# Patient Record
Sex: Female | Born: 1970
Health system: Southern US, Community
[De-identification: ages and names within clinical notes are randomized; demographics above are authoritative.]

## PROBLEM LIST (undated history)

## (undated) DIAGNOSIS — L309 Dermatitis, unspecified: Secondary | ICD-10-CM

## (undated) DIAGNOSIS — N840 Polyp of corpus uteri: Secondary | ICD-10-CM

## (undated) DIAGNOSIS — D219 Benign neoplasm of connective and other soft tissue, unspecified: Secondary | ICD-10-CM

## (undated) DIAGNOSIS — B009 Herpesviral infection, unspecified: Secondary | ICD-10-CM

## (undated) DIAGNOSIS — Z8742 Personal history of other diseases of the female genital tract: Secondary | ICD-10-CM

## (undated) DIAGNOSIS — N760 Acute vaginitis: Secondary | ICD-10-CM

## (undated) DIAGNOSIS — J302 Other seasonal allergic rhinitis: Secondary | ICD-10-CM

## (undated) DIAGNOSIS — E559 Vitamin D deficiency, unspecified: Secondary | ICD-10-CM

## (undated) HISTORY — DX: Personal history of other diseases of the female genital tract: Z87.42

## (undated) HISTORY — PX: OTHER SURGICAL HISTORY: SHX169

## (undated) HISTORY — DX: Herpesviral infection, unspecified: B00.9

## (undated) HISTORY — DX: Benign neoplasm of connective and other soft tissue, unspecified: D21.9

## (undated) HISTORY — PX: CYSTECTOMY: SUR359

## (undated) HISTORY — DX: Acute vaginitis: N76.0

## (undated) HISTORY — PX: ENDOMETRIAL ABLATION: SHX621

## (undated) HISTORY — PX: DILATION AND CURETTAGE OF UTERUS: SHX78

## (undated) HISTORY — DX: Polyp of corpus uteri: N84.0

## (undated) HISTORY — PX: TUBAL LIGATION: SHX77

---

## 1999-10-01 ENCOUNTER — Ambulatory Visit (HOSPITAL_COMMUNITY): Admission: RE | Admit: 1999-10-01 | Discharge: 1999-10-01 | Payer: Self-pay | Admitting: *Deleted

## 1999-10-01 ENCOUNTER — Encounter: Payer: Self-pay | Admitting: *Deleted

## 2000-02-19 ENCOUNTER — Inpatient Hospital Stay (HOSPITAL_COMMUNITY): Admission: AD | Admit: 2000-02-19 | Discharge: 2000-02-22 | Payer: Self-pay | Admitting: *Deleted

## 2000-11-09 ENCOUNTER — Inpatient Hospital Stay (HOSPITAL_COMMUNITY): Admission: EM | Admit: 2000-11-09 | Discharge: 2000-11-12 | Payer: Self-pay | Admitting: *Deleted

## 2000-11-13 ENCOUNTER — Other Ambulatory Visit (HOSPITAL_COMMUNITY): Admission: RE | Admit: 2000-11-13 | Discharge: 2000-11-17 | Payer: Self-pay | Admitting: Psychiatry

## 2000-12-30 ENCOUNTER — Inpatient Hospital Stay (HOSPITAL_COMMUNITY): Admission: AD | Admit: 2000-12-30 | Discharge: 2000-12-30 | Payer: Self-pay | Admitting: *Deleted

## 2000-12-30 ENCOUNTER — Ambulatory Visit (HOSPITAL_COMMUNITY): Admission: RE | Admit: 2000-12-30 | Discharge: 2000-12-30 | Payer: Self-pay | Admitting: *Deleted

## 2001-05-11 ENCOUNTER — Emergency Department (HOSPITAL_COMMUNITY): Admission: EM | Admit: 2001-05-11 | Discharge: 2001-05-11 | Payer: Self-pay | Admitting: *Deleted

## 2001-07-06 ENCOUNTER — Emergency Department (HOSPITAL_COMMUNITY): Admission: EM | Admit: 2001-07-06 | Discharge: 2001-07-06 | Payer: Self-pay | Admitting: Emergency Medicine

## 2002-03-07 ENCOUNTER — Emergency Department (HOSPITAL_COMMUNITY): Admission: EM | Admit: 2002-03-07 | Discharge: 2002-03-07 | Payer: Self-pay

## 2002-11-14 ENCOUNTER — Emergency Department (HOSPITAL_COMMUNITY): Admission: EM | Admit: 2002-11-14 | Discharge: 2002-11-14 | Payer: Self-pay | Admitting: Emergency Medicine

## 2003-05-10 ENCOUNTER — Emergency Department (HOSPITAL_COMMUNITY): Admission: EM | Admit: 2003-05-10 | Discharge: 2003-05-10 | Payer: Self-pay | Admitting: Emergency Medicine

## 2004-01-20 ENCOUNTER — Other Ambulatory Visit: Admission: RE | Admit: 2004-01-20 | Discharge: 2004-01-20 | Payer: Self-pay | Admitting: Obstetrics and Gynecology

## 2005-04-02 ENCOUNTER — Emergency Department (HOSPITAL_COMMUNITY): Admission: EM | Admit: 2005-04-02 | Discharge: 2005-04-02 | Payer: Self-pay | Admitting: Emergency Medicine

## 2005-05-23 ENCOUNTER — Emergency Department (HOSPITAL_COMMUNITY): Admission: EM | Admit: 2005-05-23 | Discharge: 2005-05-23 | Payer: Self-pay | Admitting: Emergency Medicine

## 2005-10-04 ENCOUNTER — Encounter: Admission: RE | Admit: 2005-10-04 | Discharge: 2005-10-04 | Payer: Self-pay | Admitting: Cardiology

## 2005-10-10 ENCOUNTER — Other Ambulatory Visit: Admission: RE | Admit: 2005-10-10 | Discharge: 2005-10-10 | Payer: Self-pay | Admitting: Obstetrics and Gynecology

## 2006-04-25 ENCOUNTER — Emergency Department (HOSPITAL_COMMUNITY): Admission: EM | Admit: 2006-04-25 | Discharge: 2006-04-26 | Payer: Self-pay | Admitting: Emergency Medicine

## 2006-11-27 ENCOUNTER — Emergency Department (HOSPITAL_COMMUNITY): Admission: EM | Admit: 2006-11-27 | Discharge: 2006-11-27 | Payer: Self-pay | Admitting: Emergency Medicine

## 2008-02-01 ENCOUNTER — Emergency Department (HOSPITAL_COMMUNITY): Admission: EM | Admit: 2008-02-01 | Discharge: 2008-02-01 | Payer: Self-pay | Admitting: Emergency Medicine

## 2009-12-27 ENCOUNTER — Encounter: Admission: RE | Admit: 2009-12-27 | Discharge: 2009-12-27 | Payer: Self-pay | Admitting: Obstetrics and Gynecology
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
## 2011-01-25 NOTE — Op Note (Signed)
[facility]  Patient:    Sabrina Porter, Sabrina Porter                     MRN: [id number] Proc. Date: 12/30/00 Adm. Date:  [id number] Attending:  Deniece Ree                           Operative Report  PREOPERATIVE DIAGNOSIS:       Multiparity, desirous of permanent                               sterilization.  POSTOPERATIVE DIAGNOSIS:      Multiparity, desirous of permanent                               sterilization.  OPERATION:                    Laparoscopic bilateral tubal cauterization                               with tubal resection.  SURGEON:                      Deniece Ree, M.D.  ANESTHESIA:                   General.  ESTIMATED BLOOD LOSS:         Less than 25 cc.   COMPLICATIONS:                None.  CONDITION ON DISCHARGE:       The patient tolerated the procedure well and returned to the recovery room in satisfactory condition.  DESCRIPTION OF PROCEDURE:     The patient was taken to the operating room and prepped and draped in the usual fashion for laparoscopic surgery. A speculum was placed in the vagina following which the anterior lip of the cervix was grasped with a Christella Hartigan tenaculum. The uterine manipulating cannulae were then put into place.  A subumbilical incision was then made following which the Veress needle was introduced through this incision and approximately 3 L of carbon dioxide were then infused without difficulty. The laparoscopic trocar was placed in this incision, following which a laparoscope was placed through its sleeve. Visualization of the pelvic organs came into view. The uterus, tubes and ovaries appeared to be within normal limits, however, there were some adhesions that were present. The adhesions were then sharply dissected away, following which the right tube was grasped approximately 5 mm proximal to the right cornu and cauterized approximately 2-3 cm in length. This was done likewise on the  left side, following which the tube was then cut at the two locations. Hemostasis remained present. Sponge, needle, lap and instrument counts were correct x 2. At this point the carbon dioxide was then allowed to escape from the abdominal cavity which it did so without any problems. The incision was then closed, first with a deep interrupted stitch, followed by a subcutaneous stitch using 4-0 Vicryl. The procedure terminated and tolerated the procedure well and returned to the recovery room in satisfactory condition.  DISPOSITION:                  The patient is to be discharged  when fully alert. She has been instructed on the possible complications following this type of surgery. She has been told to return to my office in four weeks for follow-up evaluation or to call me prior to that time should any problems arise.DD:  12/30/00 TD:  12/30/00 Job: [id number]

## 2011-01-25 NOTE — H&P (Signed)
[facility]  Patient:    Sabrina Porter, Sabrina Porter                     MRN: [id number] Adm. Date:  [id number] Attending:  Deniece Ree                         History and Physical  HISTORY:                      Patient is a 40 year old multiparous female who is desirous of permanent sterilization.  Patient understands the different types of contraceptives available, however, is very adamant about having permanent sterilization.  She understands that the procedure is intended to be permanent, however, cannot be guaranteed.  All of the possible complications and expectations have been explained to this patient to her satisfaction which she accepts the risks for.  PHYSICAL EXAMINATION:  GENERAL:                      Revealed a well-developed, well-nourished female in no acute distress.  HEENT:                        Within normal limits.  NECK:                         Supple.  BREASTS:                      Without masses, tenderness, or discharge.  LUNGS:                        Clear to percussion and auscultation.  HEART:                        Normal sinus rhythm without murmur, rub, or gallop.  ABDOMEN:                      Benign.  EXTREMITIES AND NEUROLOGIC:   Within normal limits.  PELVIC:                       Revealed external genitalia and BUS to be within normal limits.  The vagina is clear, cervix firm and nontender.  The uterus is normal size, shape, and consistency.  The adnexa is benign.  DIAGNOSIS:                    Multiparity, desirous of permanent sterilization.  PLAN:                         Laparoscopic bilateral tubal cauterization with tubal resection. DD:  12/30/00 TD:  12/30/00 Job: [id number]

## 2011-07-15 ENCOUNTER — Other Ambulatory Visit: Payer: Self-pay | Admitting: Obstetrics and Gynecology

## 2011-07-15 DIAGNOSIS — Z1231 Encounter for screening mammogram for malignant neoplasm of breast: Secondary | ICD-10-CM

## 2011-07-18 ENCOUNTER — Ambulatory Visit
Admission: RE | Admit: 2011-07-18 | Discharge: 2011-07-18 | Disposition: A | Discharge status: Home / Self Care | Payer: Private Health Insurance - Indemnity | Source: Ambulatory Visit | Attending: Obstetrics and Gynecology | Admitting: Obstetrics and Gynecology

## 2011-07-18 DIAGNOSIS — Z1231 Encounter for screening mammogram for malignant neoplasm of breast: Secondary | ICD-10-CM

## 2011-08-26 ENCOUNTER — Encounter (HOSPITAL_COMMUNITY): Payer: Self-pay | Admitting: *Deleted

## 2011-08-26 ENCOUNTER — Other Ambulatory Visit: Payer: Self-pay | Admitting: Obstetrics and Gynecology

## 2011-08-28 ENCOUNTER — Encounter (HOSPITAL_COMMUNITY): Payer: Self-pay | Admitting: Pharmacist

## 2011-09-04 NOTE — H&P (Signed)
[name], [name] NO.:  [id number]  MEDICAL RECORD NO.:  [id number]  LOCATION:  [facility]                         FACILITY:  [facility]  PHYSICIAN:  Janine Limbo, M.D.DATE OF BIRTH:  [birth year]  DATE OF ADMISSION:  08/23/2011 DATE OF DISCHARGE:                             HISTORY & PHYSICAL   DATE FOR SURGERY:  September 05, 2011.  HISTORY OF PRESENT ILLNESS:  [name] is a 40 year old female, para 2- 0-1-2, who presents for hysteroscopy with resection of an endometrial polyp.  She will also have a dilatation and curettage followed by NovaSure ablation of the endometrium.  The patient has been followed at the [facility].  She complains of menorrhagia.  She has a known history of fibroids.  A hydrosonogram was performed, and it showed multiple fibroids with the largest fibroid measuring less than 3.36 cm. The endometrial cavity contained a 1.3-cm mass consistent with an endometrial polyp.  Her endometrial biopsy was benign.  The patient has had a tubal ligation in the past.  The patient has a history of cryosurgery.  Her most recent Pap smear was within normal limits. Gonorrhea and Chlamydia tests were negative.  Urine culture was negative.  Her hemoglobin was 10.8.  DRUG ALLERGIES:  No known drug allergies.  PAST MEDICAL HISTORY:  The patient has a history of hepatitis C.  She also has hypertension, diabetes, elevated cholesterol, and pelvic discomfort.  SOCIAL HISTORY:  The patient denies cigarette use, alcohol use, and recreational drug use.  OBSTETRICAL HISTORY:  The patient has had 2 term deliveries and 1 elective pregnancy termination in the first trimester.  REVIEW OF SYSTEMS:  The patient complains of occasional constipation. She also complains of menorrhagia.  FAMILY HISTORY:  Noncontributory.  PHYSICAL EXAMINATION:  VITAL SIGNS:  Height is 5 feet 5 inches, weight is  177 pounds. HEENT:  Within normal limits. CHEST:  Clear. HEART:  Regular rate and rhythm. BREASTS:  Without masses. ABDOMEN:  Nontender. EXTREMITIES:  Grossly normal. NEUROLOGIC:  Grossly normal. PELVIC:  External genitalia is normal.  Vagina is normal.  Cervix is nontender.  The uterus is 10-12 week size and irregular.  Adnexa, no masses and rectovaginal exam confirms.  ASSESSMENT: 1. Menorrhagia. 2. Endometrial polyps. 3. Fibroids. 4. Hepatitis C. 5. Anemia. 6. Hypertension. 7. Diabetes. 8. Hyperlipidemia. 9. Obesity.  PLAN:  The patient will undergo hysteroscopy with resection of an endometrial polyp.  She will then have a dilatation and curettage followed by NovaSure ablation of the endometrium.  The patient understands the indications for her surgical procedure, and she accepts the risks of, but not limited to, anesthetic complications, bleeding, infections, and possible damage to the surrounding organs.     Janine Limbo, M.D.     AVS/MEDQ  D:  09/04/2011  T:  09/04/2011  Job:  [id number]

## 2011-09-05 ENCOUNTER — Encounter (HOSPITAL_COMMUNITY): Payer: Self-pay | Admitting: Anesthesiology

## 2011-09-05 ENCOUNTER — Encounter (HOSPITAL_COMMUNITY)
Admission: RE | Disposition: A | Discharge status: Home / Self Care | Payer: Self-pay | Source: Ambulatory Visit | Attending: Obstetrics and Gynecology

## 2011-09-05 ENCOUNTER — Ambulatory Visit (HOSPITAL_COMMUNITY)
Admission: RE | Admit: 2011-09-05 | Discharge: 2011-09-05 | Disposition: A | Discharge status: Home / Self Care | Payer: Managed Care, Other (non HMO) | Source: Ambulatory Visit | Attending: Obstetrics and Gynecology | Admitting: Obstetrics and Gynecology

## 2011-09-05 ENCOUNTER — Other Ambulatory Visit: Payer: Self-pay | Admitting: Obstetrics and Gynecology

## 2011-09-05 ENCOUNTER — Ambulatory Visit (HOSPITAL_COMMUNITY): Payer: Managed Care, Other (non HMO) | Admitting: Anesthesiology

## 2011-09-05 DIAGNOSIS — E119 Type 2 diabetes mellitus without complications: Secondary | ICD-10-CM | POA: Insufficient documentation

## 2011-09-05 DIAGNOSIS — N840 Polyp of corpus uteri: Secondary | ICD-10-CM | POA: Insufficient documentation

## 2011-09-05 DIAGNOSIS — N920 Excessive and frequent menstruation with regular cycle: Secondary | ICD-10-CM | POA: Insufficient documentation

## 2011-09-05 DIAGNOSIS — I10 Essential (primary) hypertension: Secondary | ICD-10-CM | POA: Insufficient documentation

## 2011-09-05 DIAGNOSIS — D250 Submucous leiomyoma of uterus: Secondary | ICD-10-CM | POA: Insufficient documentation

## 2011-09-05 LAB — CBC
Hemoglobin: 12 g/dL (ref 12.0–15.0)
MCV: 81.4 fL (ref 78.0–100.0)
RBC: 4.69 MIL/uL (ref 3.87–5.11)
WBC: 8.2 10*3/uL (ref 4.0–10.5)

## 2011-09-05 LAB — PREGNANCY, URINE: Preg Test, Ur: NEGATIVE

## 2011-09-05 SURGERY — DILATATION & CURETTAGE/HYSTEROSCOPY WITH NOVASURE ABLATION
Anesthesia: General | Site: Vagina | Wound class: Clean Contaminated

## 2011-09-05 MED ORDER — LIDOCAINE HCL (CARDIAC) 20 MG/ML IV SOLN
INTRAVENOUS | Status: AC
  Filled 2011-09-05: qty 5

## 2011-09-05 MED ORDER — MIDAZOLAM HCL 2 MG/2ML IJ SOLN
INTRAMUSCULAR | Status: AC
  Filled 2011-09-05: qty 2

## 2011-09-05 MED ORDER — KETOROLAC TROMETHAMINE 30 MG/ML IJ SOLN
INTRAMUSCULAR | Status: DC | PRN
  Administered 2011-09-05 (×2): 30 mg via INTRAMUSCULAR

## 2011-09-05 MED ORDER — FENTANYL CITRATE 0.05 MG/ML IJ SOLN
25.0000 ug | INTRAMUSCULAR | Status: DC | PRN
  Administered 2011-09-05: 50 ug via INTRAVENOUS

## 2011-09-05 MED ORDER — ONDANSETRON HCL 4 MG/2ML IJ SOLN: 4.0000 mg | Freq: Once | INTRAMUSCULAR | Status: DC | PRN

## 2011-09-05 MED ORDER — ONDANSETRON HCL 4 MG/2ML IJ SOLN
INTRAMUSCULAR | Status: DC | PRN
  Administered 2011-09-05: 4 mg via INTRAVENOUS

## 2011-09-05 MED ORDER — MIDAZOLAM HCL 5 MG/5ML IJ SOLN
INTRAMUSCULAR | Status: DC | PRN
  Administered 2011-09-05: 2 mg via INTRAVENOUS

## 2011-09-05 MED ORDER — LACTATED RINGERS IV SOLN
INTRAVENOUS | Status: DC | PRN
  Administered 2011-09-05: 3000 mL via INTRAVENOUS

## 2011-09-05 MED ORDER — HYDROCODONE-ACETAMINOPHEN 5-325 MG PO TABS
1.0000 | ORAL_TABLET | Freq: Once | ORAL | Status: AC
  Administered 2011-09-05: 1 via ORAL

## 2011-09-05 MED ORDER — GLYCINE 1.5 % IR SOLN
Status: DC | PRN
  Administered 2011-09-05: 3000 mL

## 2011-09-05 MED ORDER — PROMETHAZINE HCL 12.5 MG PO TABS: 12.5000 mg | ORAL_TABLET | Freq: Four times a day (QID) | ORAL | Status: DC | PRN

## 2011-09-05 MED ORDER — IBUPROFEN 200 MG PO TABS: 800.0000 mg | ORAL_TABLET | Freq: Three times a day (TID) | ORAL | Status: AC | PRN

## 2011-09-05 MED ORDER — BUPIVACAINE-EPINEPHRINE 0.5% -1:200000 IJ SOLN
INTRAMUSCULAR | Status: DC | PRN
  Administered 2011-09-05: 10 mL

## 2011-09-05 MED ORDER — FENTANYL CITRATE 0.05 MG/ML IJ SOLN
INTRAMUSCULAR | Status: AC
  Filled 2011-09-05: qty 2

## 2011-09-05 MED ORDER — HYDROCODONE-ACETAMINOPHEN 5-325 MG PO TABS
ORAL_TABLET | ORAL | Status: AC
  Filled 2011-09-05: qty 1

## 2011-09-05 MED ORDER — MEPERIDINE HCL 25 MG/ML IJ SOLN
6.2500 mg | INTRAMUSCULAR | Status: DC | PRN
Start: 2011-09-05 — End: 2011-09-05

## 2011-09-05 MED ORDER — HYDROCODONE-ACETAMINOPHEN 5-500 MG PO TABS: 1.0000 | ORAL_TABLET | ORAL | Status: AC | PRN

## 2011-09-05 MED ORDER — PHENYLEPHRINE 40 MCG/ML (10ML) SYRINGE FOR IV PUSH (FOR BLOOD PRESSURE SUPPORT)
PREFILLED_SYRINGE | INTRAVENOUS | Status: AC
  Filled 2011-09-05: qty 5

## 2011-09-05 MED ORDER — LACTATED RINGERS IV SOLN
INTRAVENOUS | Status: DC
  Administered 2011-09-05 (×2): via INTRAVENOUS

## 2011-09-05 MED ORDER — FENTANYL CITRATE 0.05 MG/ML IJ SOLN
INTRAMUSCULAR | Status: DC | PRN
  Administered 2011-09-05: 100 ug via INTRAVENOUS

## 2011-09-05 MED ORDER — PROPOFOL 10 MG/ML IV EMUL
INTRAVENOUS | Status: DC | PRN
  Administered 2011-09-05: 200 mg via INTRAVENOUS

## 2011-09-05 MED ORDER — ONDANSETRON HCL 4 MG/2ML IJ SOLN
INTRAMUSCULAR | Status: AC
  Filled 2011-09-05: qty 2

## 2011-09-05 MED ORDER — DEXAMETHASONE SODIUM PHOSPHATE 10 MG/ML IJ SOLN
INTRAMUSCULAR | Status: AC
  Filled 2011-09-05: qty 1

## 2011-09-05 MED ORDER — DEXAMETHASONE SODIUM PHOSPHATE 10 MG/ML IJ SOLN
INTRAMUSCULAR | Status: DC | PRN
  Administered 2011-09-05: 10 mg via INTRAVENOUS

## 2011-09-05 MED ORDER — KETOROLAC TROMETHAMINE 30 MG/ML IJ SOLN: 15.0000 mg | Freq: Once | INTRAMUSCULAR | Status: DC | PRN

## 2011-09-05 SURGICAL SUPPLY — 15 items
CANISTER SUCTION 2500CC (MISCELLANEOUS) ×2 IMPLANT
CATH ROBINSON RED A/P 16FR (CATHETERS) ×2 IMPLANT
CLOTH BEACON ORANGE TIMEOUT ST (SAFETY) ×2 IMPLANT
CONTAINER PREFILL 10% NBF 60ML (FORM) ×5 IMPLANT
ELECT REM PT RETURN 9FT ADLT (ELECTROSURGICAL) ×2
ELECTRODE REM PT RTRN 9FT ADLT (ELECTROSURGICAL) IMPLANT
GLOVE BIOGEL PI IND STRL 8.5 (GLOVE) ×1 IMPLANT
GLOVE BIOGEL PI INDICATOR 8.5 (GLOVE) ×1
GLOVE ECLIPSE 8.0 STRL XLNG CF (GLOVE) ×4 IMPLANT
GOWN PREVENTION PLUS LG XLONG (DISPOSABLE) ×2 IMPLANT
GOWN STRL REIN XL XLG (GOWN DISPOSABLE) ×2 IMPLANT
LOOP ANGLED CUTTING 22FR (CUTTING LOOP) ×1 IMPLANT
PACK HYSTEROSCOPY LF (CUSTOM PROCEDURE TRAY) ×2 IMPLANT
TOWEL OR 17X24 6PK STRL BLUE (TOWEL DISPOSABLE) ×4 IMPLANT
WATER STERILE IRR 1000ML POUR (IV SOLUTION) ×2 IMPLANT

## 2011-09-05 NOTE — Op Note (Signed)
OPERATIVE NOTE  Sabrina Porter  DOB:    [birth year]  MRN:    [id number]  CSN:    [id number]  Date of Surgery:  09/05/2011  Preoperative Diagnosis:  Menorrhagia  Fibroids  Endometrial polyps  History of anemia  Postoperative Diagnosis:  Menorrhagia  Submucosal fibroids  Procedure:  Hysteroscopy with resection of submucosal fibroids  Dilatation and curettage  NovaSure endometrial ablation  Surgeon:  Leonard Schwartz, M.D.  Assistant:  None  Anesthetic:  General  Disposition:  The patient is a 40 year old female who presents with the above diagnosis. Outpatient measures have not relieved her discomfort. She understands the indications for surgical procedure as well as the alternative treatment options. She accepts the risk of, but not limited to, anesthetic complications, bleeding, infections, and possible damage to the surrounding organs.  Findings:  The uterus is 12-14 week size and irregular. No adnexal masses are appreciated. On hysteroscopy the patient is noted to have 2 submucosal fibroids with minimal indentation of the endometrial cavity. No other pathology was appreciated.  Procedure:  The patient was taken to the operating room where a general anesthetic was given. The patient's perineum and vagina were prepped with multiple layers of Betadine. The bladder was drained of urine. The patient was sterilely draped. Examination under anesthesia was performed. A paracervical block was placed using 10 cc of half percent Marcaine with epinephrine. An endocervical curettage was performed. The uterus sounded to 9 cm. The cervix was gently dilated. The cervical length was 4 cm. The hysteroscope was inserted and the cavity was inspected. Pictures were taken. The submucosal fibroids were resected using a single loop. Care was taken not to penetrate deeply into the myometrium. Hemostasis was adequate. The hysteroscope was removed and the cavity was curetted  using a medium sharp curet until was felt to be clean. We then tested the NovaSure apparatus to be sure that it was functioning properly. The cavity depth was noted to be 5 cm. The cavity width was noted to be 4.6 cm. A test procedure was performed and the integrity of the cavity was confirmed. Ablation was then started and the cavity was ablated for 62 seconds. The patient tolerated her procedure well. On sutures were removed. The exam was repeated and the uterus was noted to be firm. Hemostasis was adequate. The patient was returned to the supine position. She was awakened from her anesthetic without difficulty. She was transported to the recovery room in stable condition. Sponge and needle counts were correct. The endometrial resections, endometrial curettings, and endocervical curettings were sent to pathology.  Followup instructions:  The patient will return to see Dr. Stefano Gaul in 2 weeks. She was given prescriptions for Motrin, Vicodin, and Phenergan. She will have a vaginal discharge and she is to use pads rather than tampons.  Leonard Schwartz, M.D.

## 2011-09-05 NOTE — Anesthesia Postprocedure Evaluation (Signed)
Anesthesia Post Note  Patient: Sabrina Porter  Procedure(s) Performed:  DILATATION & CURETTAGE/HYSTEROSCOPY WITH NOVASURE ABLATION - Hysteroscopy w/ Resection; D&C; Novasure Endometrial Ablation  Anesthesia type: General  Patient location: PACU  Post pain: Pain level controlled  Post assessment: Post-op Vital signs reviewed  Last Vitals:  Filed Vitals:   09/05/11 0840  BP: 111/76  Pulse: 82  Temp: 36.7 C  Resp: 16    Post vital signs: Reviewed  Level of consciousness: sedated  Complications: No apparent anesthesia complicationsfj

## 2011-09-05 NOTE — H&P (Signed)
The patient was interviewed and examined today.  The previously documented history and physical examination was reviewed. There are no changes except that her hemoglobin is now 12. The operative procedure was reviewed. The risks and benefits were outlined again. The specific risks include, but are not limited to, anesthetic complications, bleeding, infections, and possible damage to the surrounding organs. The patient's questions were answered.  We are ready to proceed as outlined. The likelihood of the patient achieving the goals of this procedure is very likely.   Leonard Schwartz, M.D.

## 2011-09-05 NOTE — Transfer of Care (Signed)
Immediate Anesthesia Transfer of Care Note  Patient: Sabrina Porter  Procedure(s) Performed:  DILATATION & CURETTAGE/HYSTEROSCOPY WITH NOVASURE ABLATION - Hysteroscopy w/ Resection; D&C; Novasure Endometrial Ablation  Patient Location: PACU  Anesthesia Type: General  Level of Consciousness: awake, alert  and oriented  Airway & Oxygen Therapy: Patient Spontanous Breathing  Post-op Assessment: Report given to PACU RN  Post vital signs: Reviewed and stable  Complications: No apparent anesthesia complications2

## 2011-09-05 NOTE — Anesthesia Preprocedure Evaluation (Signed)
Anesthesia Evaluation  Patient identified by MRN, date of birth, ID band Patient awake    Reviewed: Allergy & Precautions, H&P , NPO status , Patient's Chart, lab work & pertinent test results  Airway Mallampati: I TM Distance: >3 FB Neck ROM: full    Dental No notable dental hx. (+) Teeth Intact   Pulmonary neg pulmonary ROS,    Pulmonary exam normal       Cardiovascular neg cardio ROS     Neuro/Psych Negative Neurological ROS  Negative Psych ROS   GI/Hepatic negative GI ROS, Neg liver ROS,   Endo/Other  Negative Endocrine ROS  Renal/GU negative Renal ROS  Genitourinary negative   Musculoskeletal negative musculoskeletal ROS (+)   Abdominal Normal abdominal exam  (+)   Peds negative pediatric ROS (+)  Hematology negative hematology ROS (+)   Anesthesia Other Findings   Reproductive/Obstetrics negative OB ROS                           Anesthesia Physical Anesthesia Plan  ASA: I  Anesthesia Plan: General   Post-op Pain Management:    Induction: Intravenous  Airway Management Planned: LMA  Additional Equipment:   Intra-op Plan:   Post-operative Plan:   Informed Consent: I have reviewed the patients History and Physical, chart, labs and discussed the procedure including the risks, benefits and alternatives for the proposed anesthesia with the patient or authorized representative who has indicated his/her understanding and acceptance.     Plan Discussed with: CRNA  Anesthesia Plan Comments:         Anesthesia Quick Evaluation  

## 2011-12-09 ENCOUNTER — Ambulatory Visit: Payer: Self-pay | Admitting: Obstetrics and Gynecology

## 2011-12-14 ENCOUNTER — Emergency Department (HOSPITAL_COMMUNITY)
Admission: EM | Admit: 2011-12-14 | Discharge: 2011-12-14 | Disposition: A | Discharge status: Home / Self Care | Payer: Managed Care, Other (non HMO) | Source: Home / Self Care | Attending: Family Medicine | Admitting: Family Medicine

## 2011-12-14 ENCOUNTER — Encounter (HOSPITAL_COMMUNITY): Payer: Self-pay | Admitting: *Deleted

## 2011-12-14 DIAGNOSIS — L309 Dermatitis, unspecified: Secondary | ICD-10-CM

## 2011-12-14 DIAGNOSIS — L259 Unspecified contact dermatitis, unspecified cause: Secondary | ICD-10-CM

## 2011-12-14 HISTORY — DX: Dermatitis, unspecified: L30.9

## 2011-12-14 NOTE — Discharge Instructions (Signed)
I recommend that you stop using the steroid ointment on the skin. You may use over the counter moisturizing preparation such as Eucerin, Curel, Aveeno, etc, under occlusion with gloves. Reduce the amount of hand washing, and follow up with the dermatologist as scheduled.

## 2011-12-14 NOTE — ED Notes (Signed)
Per md had reviewed instructions with pt no new meds to given  - pt not in room

## 2011-12-14 NOTE — ED Notes (Signed)
Pt asking to be discharged explained md with emergent patient  -

## 2011-12-14 NOTE — ED Notes (Signed)
Pt with rash anterior index/ middle/ring finger right side - seen by dermatologist given clobetasol propionate ointment uses 2 x a day - using cream x 6 months per pt rash was on palm of hands which has resolved follow up with dermatologist in may -

## 2011-12-14 NOTE — ED Provider Notes (Signed)
History     CSN: [id number]  Arrival date & time 12/14/11  1103   First MD Initiated Contact with Patient 12/14/11 1114      Chief Complaint  Patient presents with  . Rash    (Consider location/radiation/quality/duration/timing/severity/associated sxs/prior treatment) HPI Comments: Sabrina Porter presents for evaluation of changes in the fingertips of her RIGHT hand. She reports that she's been using clobetasol ointment on her fingertips daily over the last 6 months. She reports that she started using it for a rash on the palm which she was told was eczema, by her dermatologist. She was not told to continue to use it for 6 months persistently. She now reports hyperpigmentation, thickening, and cracks in her fingertips. She does report a history of eczema. She is left-hand dominant.  Patient is a 41 y.o. female presenting with rash.  Rash  This is a chronic problem. The current episode started more than 1 week ago. The problem has not changed since onset.The problem is associated with new medications (steroid overuse). There has been no fever. The rash is present on the right fingers. The pain is mild. The pain has been constant since onset. Associated symptoms include blisters and pain. Pertinent negatives include no itching. She has tried steriods for the symptoms. The treatment provided no relief.    Past Medical History  Diagnosis Date  . Eczema     Past Surgical History  Procedure Date  . Cystectomy     removed from chin  . Tubal ligation     History reviewed. No pertinent family history.  History  Substance Use Topics  . Smoking status: Never Smoker   . Smokeless tobacco: Not on file  . Alcohol Use: Yes     occasionally    OB History    Grav Para Term Preterm Abortions TAB SAB Ect Mult Living                  Review of Systems  Constitutional: Negative.   HENT: Negative.   Eyes: Negative.   Respiratory: Negative.   Cardiovascular: Negative.   Gastrointestinal:  Negative.   Genitourinary: Negative.   Musculoskeletal: Negative.   Skin: Positive for rash. Negative for itching.  Neurological: Negative.     Allergies  Food  Home Medications   Current Outpatient Rx  Name Route Sig Dispense Refill  . FERROUS SULFATE 325 (65 FE) MG PO TABS Oral Take 325 mg by mouth daily with breakfast.    . LORATADINE 10 MG PO TABS Oral Take 10 mg by mouth daily as needed. For allergies     . OVER THE COUNTER MEDICATION Vaginal Place 1 suppository vaginally as needed. Boric Acid vaginal suppositories - As needed to correct pH balance     . ADULT MULTIVITAMIN W/MINERALS CH Oral Take 1 tablet by mouth daily.        BP 125/81  Pulse 81  Temp(Src) 98.5 F (36.9 C) (Oral)  Resp 17  SpO2 100%  LMP 11/19/2011  Physical Exam  Nursing note and vitals reviewed. Constitutional: She is oriented to person, place, and time. She appears well-developed and well-nourished.  HENT:  Head: Normocephalic and atraumatic.  Eyes: EOM are normal.  Neck: Normal range of motion.  Pulmonary/Chest: Effort normal.  Musculoskeletal: Normal range of motion.  Neurological: She is alert and oriented to person, place, and time.  Skin: Skin is warm and dry.       Hyperpigmentation, cracks in fingertips of RIGHT hand  Psychiatric: Her behavior is  normal.    ED Course  Procedures (including critical care time)  Labs Reviewed - No data to display No results found.   1. Dermatitis       MDM  Likely changes secondary to chronic steroid use on fingertips; advised cessation of use, moisturizing under occlusion, follow up with dermatologist        Renaee Munda, MD 12/14/11 1420

## 2011-12-19 ENCOUNTER — Ambulatory Visit (INDEPENDENT_AMBULATORY_CARE_PROVIDER_SITE_OTHER): Payer: Self-pay | Admitting: Obstetrics and Gynecology

## 2011-12-19 ENCOUNTER — Encounter: Payer: Self-pay | Admitting: Obstetrics and Gynecology

## 2011-12-19 VITALS — BP 100/70 | Temp 98.8°F | Ht 67.0 in | Wt 179.0 lb

## 2011-12-19 DIAGNOSIS — R5381 Other malaise: Secondary | ICD-10-CM

## 2011-12-19 DIAGNOSIS — R5383 Other fatigue: Secondary | ICD-10-CM

## 2011-12-19 DIAGNOSIS — Z124 Encounter for screening for malignant neoplasm of cervix: Secondary | ICD-10-CM

## 2011-12-19 DIAGNOSIS — R81 Glycosuria: Secondary | ICD-10-CM | POA: Insufficient documentation

## 2011-12-19 DIAGNOSIS — N949 Unspecified condition associated with female genital organs and menstrual cycle: Secondary | ICD-10-CM

## 2011-12-19 DIAGNOSIS — E559 Vitamin D deficiency, unspecified: Secondary | ICD-10-CM

## 2011-12-19 DIAGNOSIS — Z8619 Personal history of other infectious and parasitic diseases: Secondary | ICD-10-CM

## 2011-12-19 DIAGNOSIS — D219 Benign neoplasm of connective and other soft tissue, unspecified: Secondary | ICD-10-CM | POA: Insufficient documentation

## 2011-12-19 DIAGNOSIS — Z113 Encounter for screening for infections with a predominantly sexual mode of transmission: Secondary | ICD-10-CM

## 2011-12-19 DIAGNOSIS — Z0000 Encounter for general adult medical examination without abnormal findings: Secondary | ICD-10-CM

## 2011-12-19 DIAGNOSIS — D259 Leiomyoma of uterus, unspecified: Secondary | ICD-10-CM

## 2011-12-19 DIAGNOSIS — R102 Pelvic and perineal pain: Secondary | ICD-10-CM

## 2011-12-19 DIAGNOSIS — N926 Irregular menstruation, unspecified: Secondary | ICD-10-CM | POA: Insufficient documentation

## 2011-12-19 DIAGNOSIS — A6000 Herpesviral infection of urogenital system, unspecified: Secondary | ICD-10-CM

## 2011-12-19 LAB — POCT URINALYSIS DIPSTICK
Blood, UA: NEGATIVE
Ketones, UA: NEGATIVE
Protein, UA: NEGATIVE
Spec Grav, UA: 1.015

## 2011-12-19 LAB — POCT URINE PREGNANCY: Preg Test, Ur: NEGATIVE

## 2011-12-19 LAB — LIPID PANEL
LDL Cholesterol: 82 mg/dL (ref 0–99)
Total CHOL/HDL Ratio: 4 Ratio
VLDL: 25 mg/dL (ref 0–40)

## 2011-12-19 LAB — COMPREHENSIVE METABOLIC PANEL
AST: 17 U/L (ref 0–37)
Albumin: 4 g/dL (ref 3.5–5.2)
Alkaline Phosphatase: 49 U/L (ref 39–117)
Potassium: 4.5 mEq/L (ref 3.5–5.3)
Sodium: 140 mEq/L (ref 135–145)
Total Protein: 7.3 g/dL (ref 6.0–8.3)

## 2011-12-19 LAB — CBC
Hemoglobin: 14.5 g/dL (ref 12.0–15.0)
MCH: 27.8 pg (ref 26.0–34.0)
MCV: 87.9 fL (ref 78.0–100.0)
RBC: 5.21 MIL/uL — ABNORMAL HIGH (ref 3.87–5.11)

## 2011-12-19 NOTE — Progress Notes (Signed)
Patient for annual GYN exam.  Only complaint is of fatigue and desires annual "labs" has family history of elevated lipids and personal history of low vitamin D.  O: Neck: supple without masses, no thyromegaly      Heart: RRR      Lungs: clear to auscultation      Breast: no masses, dimpling, nipple discharge, skin changes or adenopathy. Abdomen: soft, non-tender Extremities: no tenderness, FROM, no deformity Pelvic: EGBUS-wnl, vagina-rugous, cervix-no lesions & non-tender, uterus- upper limits of normal, non-tender, adnexae- no tenderness or masses, rectal-no tenderness or masses.  A: Annual GYN exam     Fatigue    FH of lipidemia    H/O Vitamin D deficiency    S/P Ablation of endometrium  P: CMET, CBC, Vitamin D 25-H & STD tests      RTO 1 year

## 2011-12-21 NOTE — Progress Notes (Signed)
Quick Note:  Initiate vitamin D protocol. Thanks, EP ______ 

## 2011-12-23 ENCOUNTER — Telehealth: Payer: Self-pay

## 2011-12-23 LAB — PAP IG, CT-NG, RFX HPV ASCU: Chlamydia Probe Amp: NEGATIVE

## 2011-12-23 MED ORDER — ERGOCALCIFEROL 1.25 MG (50000 UT) PO CAPS: 50000.0000 [IU] | ORAL_CAPSULE | ORAL | Status: AC

## 2011-12-23 NOTE — Telephone Encounter (Signed)
Tc to pt.Re:vit d level. Informed pt that vit d level is abnl. And we need to call in rx. For vitd. Will call in vit. D per protocol per ep

## 2011-12-25 ENCOUNTER — Telehealth: Payer: Self-pay | Admitting: Obstetrics and Gynecology

## 2011-12-27 ENCOUNTER — Telehealth: Payer: Self-pay

## 2011-12-27 NOTE — Telephone Encounter (Signed)
TC TO PT REGARDING ABNL PAP. INSTRUCTED PT THAT WE NEED TO SCHEDULE COLPO  AND GAVE PT INSTRUCTIONS PER PROTOCOL. COLPO SCHEDULED  FOR 01/07/2012 AT 2:OO PM WITH AVS.    LM

## 2011-12-27 NOTE — Telephone Encounter (Signed)
Message copied by Winfred Leeds on Fri Dec 27, 2011 10:27 AM ------      Message from: Henreitta Leber      Created: Wed Dec 25, 2011  9:52 PM      Regarding: Schedule Colposcopy       Please schedule colposcopy for this patient with PAP showing ASCUS with + HPV.  Thanks,  EP

## 2011-12-27 NOTE — Telephone Encounter (Signed)
Tc to inform pt about other labs. All test were wnl except vit d and pap. Already instructed pt on the abnl. Pt voiced understanding

## 2012-01-07 ENCOUNTER — Encounter: Payer: Self-pay | Admitting: Obstetrics and Gynecology

## 2012-01-07 ENCOUNTER — Encounter (INDEPENDENT_AMBULATORY_CARE_PROVIDER_SITE_OTHER): Payer: Private Health Insurance - Indemnity

## 2012-01-07 ENCOUNTER — Ambulatory Visit (INDEPENDENT_AMBULATORY_CARE_PROVIDER_SITE_OTHER): Payer: Private Health Insurance - Indemnity | Admitting: Obstetrics and Gynecology

## 2012-01-07 VITALS — BP 106/70 | Wt 182.0 lb

## 2012-01-07 DIAGNOSIS — A630 Anogenital (venereal) warts: Secondary | ICD-10-CM

## 2012-01-07 DIAGNOSIS — Z9889 Other specified postprocedural states: Secondary | ICD-10-CM | POA: Insufficient documentation

## 2012-01-07 DIAGNOSIS — E663 Overweight: Secondary | ICD-10-CM

## 2012-01-07 DIAGNOSIS — R87811 Vaginal high risk human papillomavirus (HPV) DNA test positive: Secondary | ICD-10-CM

## 2012-01-07 DIAGNOSIS — Z9851 Tubal ligation status: Secondary | ICD-10-CM

## 2012-01-07 DIAGNOSIS — IMO0002 Reserved for concepts with insufficient information to code with codable children: Secondary | ICD-10-CM

## 2012-01-07 DIAGNOSIS — B977 Papillomavirus as the cause of diseases classified elsewhere: Secondary | ICD-10-CM

## 2012-01-07 NOTE — Progress Notes (Signed)
Colposcopy Procedure Note  Indications: Pap smear 3 months ago showed: ASCUS with POSITIVE high risk HPV. The prior pap showed benign cellular changes.  Prior cervical/vaginal disease: normal exam without visible pathology. Prior cervical treatment: none..  Procedure Details  The risks and benefits of the procedure and Written informed consent obtained.  Speculum placed in vagina and excellent visualization of cervix achieved, cervix swabbed x 3 with acetic acid solution.  Findings: Cervix: acetowhite lesion(s) noted at 12 o'clock; endocervical speculum placed. Transition zone seen. Vaginal inspection: normal without visible lesions. Vulvar colposcopy: vulvar colposcopy not performed.  Specimens: biopsy at 12:00, ECC  Complications: none.  Plan: Specimens labelled and sent to Pathology. Return to discuss Pathology results in 2 weeks.

## 2012-01-21 ENCOUNTER — Encounter: Payer: Self-pay | Admitting: Obstetrics and Gynecology

## 2012-01-28 ENCOUNTER — Telehealth: Payer: Self-pay | Admitting: Obstetrics and Gynecology

## 2012-01-28 ENCOUNTER — Encounter: Payer: Private Health Insurance - Indemnity | Admitting: Obstetrics and Gynecology

## 2012-01-28 NOTE — Telephone Encounter (Signed)
Triage/epic 

## 2012-02-05 ENCOUNTER — Encounter: Payer: Private Health Insurance - Indemnity | Admitting: Obstetrics and Gynecology

## 2012-02-20 ENCOUNTER — Encounter: Payer: Self-pay | Admitting: Obstetrics and Gynecology

## 2012-02-20 ENCOUNTER — Ambulatory Visit (INDEPENDENT_AMBULATORY_CARE_PROVIDER_SITE_OTHER): Payer: Private Health Insurance - Indemnity | Admitting: Obstetrics and Gynecology

## 2012-02-20 VITALS — BP 116/64 | Ht 66.0 in | Wt 184.0 lb

## 2012-02-20 DIAGNOSIS — B977 Papillomavirus as the cause of diseases classified elsewhere: Secondary | ICD-10-CM

## 2012-02-20 DIAGNOSIS — N946 Dysmenorrhea, unspecified: Secondary | ICD-10-CM

## 2012-02-20 DIAGNOSIS — R87610 Atypical squamous cells of undetermined significance on cytologic smear of cervix (ASC-US): Secondary | ICD-10-CM

## 2012-02-20 NOTE — Patient Instructions (Signed)
Hysterectomy Information  A hysterectomy is a procedure where your uterus is surgically removed. It will no longer be possible to have menstrual periods or to become pregnant. The tubes and ovaries can be removed (bilateral salpingo-oopherectomy) during this surgery as well.  REASONS FOR A HYSTERECTOMY  Persistent, abnormal bleeding.   Lasting (chronic) pelvic pain or infection.   The lining of the uterus (endometrium) starts growing outside the uterus (endometriosis).   The endometrium starts growing in the muscle of the uterus (adenomyosis).   The uterus falls down into the vagina (pelvic organ prolapse).   Symptomatic uterine fibroids.   Precancerous cells.   Cervical cancer or uterine cancer.  TYPES OF HYSTERECTOMIES  Supracervical hysterectomy. This type removes the top part of the uterus, but not the cervix.   Total hysterectomy. This type removes the uterus and cervix.   Radical hysterectomy. This type removes the uterus, cervix, and the fibrous tissue that holds the uterus in place in the pelvis (parametrium).  WAYS A HYSTERECTOMY CAN BE PERFORMED  Abdominal hysterectomy. A large surgical cut (incision) is made in the abdomen. The uterus is removed through this incision.   Vaginal hysterectomy. An incision is made in the vagina. The uterus is removed through this incision. There are no abdominal incisions.   Conventional laparoscopic hysterectomy. A thin, lighted tube with a camera (laparoscope) is inserted into 3 or 4 small incisions in the abdomen. The uterus is cut into small pieces. The small pieces are removed through the incisions, or they are removed through the vagina.   Laparoscopic assisted vaginal hysterectomy (LAVH). Three or four small incisions are made in the abdomen. Part of the surgery is performed laparoscopically and part vaginally. The uterus is removed through the vagina.   Robot-assisted laparoscopic hysterectomy. A laparoscope is inserted into 3 or 4  small incisions in the abdomen. A computer-controlled device is used to give the surgeon a 3D image. This allows for more precise movements of surgical instruments. The uterus is cut into small pieces and removed through the incisions or removed through the vagina.  RISKS OF HYSTERECTOMY   Bleeding and risk of blood transfusion. Tell your caregiver if you do not want to receive any blood products.   Blood clots in the legs or lung.   Infection.   Injury to surrounding organs.   Anesthesia problems or side effects.   Conversion to an abdominal hysterectomy.  WHAT TO EXPECT AFTER A HYSTERECTOMY  You will be given pain medicine.   You will need to have someone with you for the first 3 to 5 days after you go home.   You will need to follow up with your surgeon in 2 to 4 weeks after surgery to evaluate your progress.   You may have early menopause symptoms like hot flashes, night sweats, and insomnia.   If you had a hysterectomy for a problem that was not a cancer or a condition that could lead to cancer, then you no longer need Pap tests. However, even if you no longer need a Pap test, a regular exam is a good idea to make sure no other problems are starting.  Document Released: 02/19/2001 Document Revised: 08/15/2011 Document Reviewed: 04/06/2011 [employer] Patient Information 2012 [employer]. 

## 2012-02-20 NOTE — Progress Notes (Signed)
Pt here to discuss pathology results from 01/07/2012  HISTORY OF PRESENT ILLNESS  [name] is a 41 y.o. year old female,G2P2002, who presents for a problem visit. The patient had endometrial ablation for menorrhagia and dysmenorrhea.  She had colposcopy and biopsies for a Pap smear that showed atypical squamous cells with high risk human papilloma virus.  Biopsy results showed koilocytic atypia and no signs of dysplasia.  Subjective:  The patient reports she continues to have significant dysmenorrhea.  Her bleeding is better than before the endometrial ablation.  Objective:  BP 116/64  Ht 5\' 6"  (1.676 m)  Wt 184 lb (83.462 kg)  BMI 29.70 kg/m2  LMP 02/08/2012   General: alert, cooperative and no distress  Exam deferred.  Assessment:  Ascus Pap High-risk human papilloma virus Dysmenorrhea  Plan:  I recommended that the patient repeat her Pap smear and 5 months.  The patient was to consider hysterectomy.  A 20 min. Discussion was held about this including the alternative treatment options.  The patient will let us know her decision.  Return to office in 5 month(s).   Leonard Schwartz M.D.  02/20/2012 9:16 AM

## 2012-02-26 ENCOUNTER — Telehealth: Payer: Self-pay | Admitting: Obstetrics and Gynecology

## 2012-03-04 ENCOUNTER — Telehealth: Payer: Self-pay | Admitting: Obstetrics and Gynecology

## 2012-03-04 ENCOUNTER — Other Ambulatory Visit: Payer: Self-pay | Admitting: Obstetrics and Gynecology

## 2012-03-04 NOTE — Telephone Encounter (Signed)
Vaginal Hysterectomy scheduled for 04/16/12 @ 9:30 with AVS/AR.  [employer] effective 09/10/11. Plan pays 80/20 after a $500 deductible. Pre-op due $324.23  -Adrianne Pridgen

## 2012-03-19 ENCOUNTER — Encounter: Payer: Self-pay | Admitting: Obstetrics and Gynecology

## 2012-03-19 ENCOUNTER — Ambulatory Visit (INDEPENDENT_AMBULATORY_CARE_PROVIDER_SITE_OTHER): Payer: Private Health Insurance - Indemnity | Admitting: Obstetrics and Gynecology

## 2012-03-19 VITALS — BP 104/76 | HR 78 | Temp 98.5°F | Resp 16 | Ht 67.0 in | Wt 180.0 lb

## 2012-03-19 DIAGNOSIS — N946 Dysmenorrhea, unspecified: Secondary | ICD-10-CM

## 2012-03-19 NOTE — Progress Notes (Signed)
HISTORY OF PRESENT ILLNESS  [name] is a 41 y.o. year old female,G2P2002, who presents for a problem visit. She is scheduled for a vaginal hysterectomy.  The patient has known fibroids.  Subjective:  She complains of dysmenorrhea.  Objective:  BP 104/76  Pulse 78  Temp 98.5 F (36.9 C) (Oral)  Resp 16  Ht 5\' 7"  (1.702 m)  Wt 180 lb (81.647 kg)  BMI 28.19 kg/m2  LMP 02/04/2012   General: alert, cooperative and no distress Resp: clear to auscultation bilaterally Cardio: regular rate and rhythm, S1, S2 normal, no murmur, click, rub or gallop GI: soft, non-tender; bowel sounds normal; no masses,  no organomegaly  External genitalia: normal general appearance Vaginal: normal without tenderness, induration or masses and relaxation noted Cervix: normal appearance Adnexa: normal bimanual exam Uterus: irregular enlargement  Assessment:  10 week size fibroid uterus Dysmenorrhea  Plan:  The patient wants to proceed with vaginal hysterectomy.  Risk and benefits reviewed.  Hysterectomy performed completed. Ready to proceed.   Leonard Schwartz M.D.  03/19/2012 10:44 AM

## 2012-04-07 ENCOUNTER — Encounter (HOSPITAL_COMMUNITY): Payer: Self-pay

## 2012-04-07 ENCOUNTER — Encounter (HOSPITAL_COMMUNITY)
Admission: RE | Admit: 2012-04-07 | Discharge: 2012-04-07 | Disposition: A | Discharge status: Home / Self Care | Payer: Managed Care, Other (non HMO) | Source: Ambulatory Visit | Attending: Obstetrics and Gynecology | Admitting: Obstetrics and Gynecology

## 2012-04-07 HISTORY — DX: Other seasonal allergic rhinitis: J30.2

## 2012-04-07 HISTORY — DX: Vitamin D deficiency, unspecified: E55.9

## 2012-04-07 LAB — CBC
HCT: 43.8 % (ref 36.0–46.0)
Hemoglobin: 14.3 g/dL (ref 12.0–15.0)
MCH: 29.5 pg (ref 26.0–34.0)
MCHC: 32.6 g/dL (ref 30.0–36.0)

## 2012-04-07 LAB — SURGICAL PCR SCREEN: Staphylococcus aureus: NEGATIVE

## 2012-04-07 NOTE — Patient Instructions (Addendum)
   Your procedure is scheduled on: Thursday, 04/16/12  Enter through the Main Entrance of [facility] at: 8 am Pick up the phone at the desk and dial [phone] and inform us of your arrival.  Please call this number if you have any problems the morning of surgery: [phone]  Remember: Do not eat food after midnight: Wednesday Do not drink clear liquids after: Wednesday Take these medicines the morning of surgery with a SIP OF WATER:  None  Do not wear jewelry, make-up, or FINGER nail polish No metal in your hair or on your body. Do not wear lotions, powders, perfumes or deodorant. Do not shave 48 hours prior to surgery. Do not bring valuables to the hospital. Contacts, dentures or bridgework may not be worn into surgery.  Leave suitcase in the car. After Surgery it may be brought to your room. For patients being admitted to the hospital, checkout time is 11:00am the day of discharge.  Remember to use your hibiclens as instructed.Please shower with 1/2 bottle the evening before your surgery and the other 1/2 bottle the morning of surgery. Neck down avoiding private area.

## 2012-04-08 ENCOUNTER — Encounter (HOSPITAL_COMMUNITY): Payer: Self-pay | Admitting: Pharmacist

## 2012-04-15 ENCOUNTER — Telehealth: Payer: Self-pay | Admitting: Obstetrics and Gynecology

## 2012-04-15 NOTE — Telephone Encounter (Signed)
The patient called.  No answer.  Message left about surgery.  Dr. Stefano Gaul

## 2012-04-15 NOTE — H&P (Signed)
  HISTORY & PHYSICAL   DATE FOR SURGERY: April 16, 2012.   HISTORY OF PRESENT ILLNESS: [name] is a 41 year old female, para 2-  0-1-2, who presents for a vaginal hysterectomy. The patient has been followed at  the [facility]. She complains of menorrhagia. She has a known  history of fibroids. A hydrosonogram was performed, and it showed  multiple fibroids with the largest fibroid measuring less than 3.36 cm.  In December 2012 the patient had hysteroscopy, resection, dilatation and curettage, and ablation of the endometrium.  She continues to have heavy bleeding.  Her path report was benign. The patient has  had a tubal ligation in the past. The patient has a history of  cryosurgery. Her most recent Pap smear was within normal limits.  Gonorrhea and Chlamydia tests were negative. Urine culture was  negative. Her hemoglobin was 10.8.   DRUG ALLERGIES: No known drug allergies.   PAST MEDICAL HISTORY: The patient has a history of hepatitis C. She  also has hypertension, diabetes, elevated cholesterol, and pelvic  discomfort.   SOCIAL HISTORY: The patient denies cigarette use, alcohol use, and  recreational drug use.   OBSTETRICAL HISTORY: The patient has had 2 term deliveries and 1  elective pregnancy termination in the first trimester.   REVIEW OF SYSTEMS: The patient complains of occasional constipation.  She also complains of menorrhagia.   FAMILY HISTORY: Noncontributory.   PHYSICAL EXAMINATION: VITAL SIGNS: Height is 5 feet 5 inches, weight  is 177 pounds.   HEENT: Within normal limits.  CHEST: Clear.  HEART: Regular rate and rhythm.  BREASTS: Without masses.  ABDOMEN: Nontender.  EXTREMITIES: Grossly normal.  NEUROLOGIC: Grossly normal.   PELVIC: External genitalia is normal. Vagina is normal. Cervix is  nontender. The uterus is 10-12 week size and irregular. Adnexa, no  masses and  rectovaginal exam confirms.   ASSESSMENT:   1. Menorrhagia.  2. Fibroids. 3 hepatitis C. 4 anemia. 5 hypertension. 6 diabetes. 7 obesity. 8 hyperlipidemia.  PLAN: The patient will undergo a vaginal hysterectomy.The patient  understands the indications for her surgical procedure, and she accepts  the risks of, but not limited to, anesthetic complications, bleeding,  infections, and possible damage to the surrounding organs.   Janine Limbo, M.D.

## 2012-04-16 ENCOUNTER — Encounter (HOSPITAL_COMMUNITY): Payer: Self-pay | Admitting: Anesthesiology

## 2012-04-16 ENCOUNTER — Encounter (HOSPITAL_COMMUNITY): Payer: Self-pay | Admitting: *Deleted

## 2012-04-16 ENCOUNTER — Encounter (HOSPITAL_COMMUNITY)
Admission: RE | Disposition: A | Discharge status: Home / Self Care | Payer: Self-pay | Source: Ambulatory Visit | Attending: Obstetrics and Gynecology

## 2012-04-16 ENCOUNTER — Ambulatory Visit (HOSPITAL_COMMUNITY)
Admission: RE | Admit: 2012-04-16 | Discharge: 2012-04-17 | Disposition: A | Discharge status: Home / Self Care | Payer: Managed Care, Other (non HMO) | Source: Ambulatory Visit | Attending: Obstetrics and Gynecology | Admitting: Obstetrics and Gynecology

## 2012-04-16 ENCOUNTER — Ambulatory Visit (HOSPITAL_COMMUNITY): Payer: Managed Care, Other (non HMO) | Admitting: Anesthesiology

## 2012-04-16 DIAGNOSIS — N920 Excessive and frequent menstruation with regular cycle: Secondary | ICD-10-CM | POA: Insufficient documentation

## 2012-04-16 DIAGNOSIS — N946 Dysmenorrhea, unspecified: Secondary | ICD-10-CM | POA: Insufficient documentation

## 2012-04-16 DIAGNOSIS — D252 Subserosal leiomyoma of uterus: Secondary | ICD-10-CM | POA: Insufficient documentation

## 2012-04-16 DIAGNOSIS — N8000 Endometriosis of the uterus, unspecified: Secondary | ICD-10-CM | POA: Insufficient documentation

## 2012-04-16 DIAGNOSIS — D259 Leiomyoma of uterus, unspecified: Secondary | ICD-10-CM

## 2012-04-16 DIAGNOSIS — E669 Obesity, unspecified: Secondary | ICD-10-CM | POA: Insufficient documentation

## 2012-04-16 DIAGNOSIS — B1920 Unspecified viral hepatitis C without hepatic coma: Secondary | ICD-10-CM | POA: Insufficient documentation

## 2012-04-16 DIAGNOSIS — D251 Intramural leiomyoma of uterus: Secondary | ICD-10-CM | POA: Insufficient documentation

## 2012-04-16 DIAGNOSIS — D649 Anemia, unspecified: Secondary | ICD-10-CM | POA: Insufficient documentation

## 2012-04-16 DIAGNOSIS — I10 Essential (primary) hypertension: Secondary | ICD-10-CM | POA: Insufficient documentation

## 2012-04-16 HISTORY — PX: VAGINAL HYSTERECTOMY: SHX2639

## 2012-04-16 LAB — PREGNANCY, URINE: Preg Test, Ur: NEGATIVE

## 2012-04-16 SURGERY — HYSTERECTOMY, VAGINAL
Anesthesia: General | Site: Vagina | Wound class: Clean Contaminated

## 2012-04-16 MED ORDER — DIPHENHYDRAMINE HCL 12.5 MG/5ML PO ELIX
12.5000 mg | ORAL_SOLUTION | Freq: Four times a day (QID) | ORAL | Status: DC | PRN
  Filled 2012-04-16: qty 5

## 2012-04-16 MED ORDER — KETOROLAC TROMETHAMINE 30 MG/ML IJ SOLN
30.0000 mg | Freq: Four times a day (QID) | INTRAMUSCULAR | Status: DC
  Administered 2012-04-16 – 2012-04-17 (×3): 30 mg via INTRAVENOUS
  Filled 2012-04-16 (×3): qty 1

## 2012-04-16 MED ORDER — PANTOPRAZOLE SODIUM 40 MG PO TBEC
40.0000 mg | DELAYED_RELEASE_TABLET | Freq: Every day | ORAL | Status: DC
  Administered 2012-04-16: 40 mg via ORAL
  Filled 2012-04-16 (×2): qty 1

## 2012-04-16 MED ORDER — ONDANSETRON HCL 4 MG PO TABS: 4.0000 mg | ORAL_TABLET | Freq: Four times a day (QID) | ORAL | Status: DC | PRN

## 2012-04-16 MED ORDER — GLYCOPYRROLATE 0.2 MG/ML IJ SOLN
INTRAMUSCULAR | Status: DC | PRN
  Administered 2012-04-16: 0.1 mg via INTRAVENOUS

## 2012-04-16 MED ORDER — CEFAZOLIN SODIUM-DEXTROSE 2-3 GM-% IV SOLR
INTRAVENOUS | Status: AC
  Filled 2012-04-16: qty 50

## 2012-04-16 MED ORDER — DIPHENHYDRAMINE HCL 50 MG/ML IJ SOLN: 12.5000 mg | Freq: Four times a day (QID) | INTRAMUSCULAR | Status: DC | PRN

## 2012-04-16 MED ORDER — OXYCODONE-ACETAMINOPHEN 5-325 MG PO TABS
1.0000 | ORAL_TABLET | ORAL | Status: DC | PRN
  Administered 2012-04-16 – 2012-04-17 (×2): 1 via ORAL
  Filled 2012-04-16 (×2): qty 1

## 2012-04-16 MED ORDER — KETOROLAC TROMETHAMINE 30 MG/ML IJ SOLN
INTRAMUSCULAR | Status: AC
  Filled 2012-04-16: qty 1

## 2012-04-16 MED ORDER — HYDROMORPHONE HCL PF 1 MG/ML IJ SOLN
INTRAMUSCULAR | Status: AC
  Filled 2012-04-16: qty 1

## 2012-04-16 MED ORDER — GLYCOPYRROLATE 0.2 MG/ML IJ SOLN
INTRAMUSCULAR | Status: AC
  Filled 2012-04-16: qty 1

## 2012-04-16 MED ORDER — LIDOCAINE HCL (CARDIAC) 20 MG/ML IV SOLN
INTRAVENOUS | Status: AC
  Filled 2012-04-16: qty 5

## 2012-04-16 MED ORDER — MENTHOL 3 MG MT LOZG: 1.0000 | LOZENGE | OROMUCOSAL | Status: DC | PRN

## 2012-04-16 MED ORDER — POLYETHYLENE GLYCOL 3350 17 G PO PACK
17.0000 g | PACK | Freq: Every day | ORAL | Status: DC | PRN
  Administered 2012-04-17: 17 g via ORAL
  Filled 2012-04-16: qty 1

## 2012-04-16 MED ORDER — PROMETHAZINE HCL 25 MG/ML IJ SOLN: 6.2500 mg | INTRAMUSCULAR | Status: DC | PRN

## 2012-04-16 MED ORDER — BUPIVACAINE-EPINEPHRINE 0.5% -1:200000 IJ SOLN
INTRAMUSCULAR | Status: DC | PRN
  Administered 2012-04-16: 50 mL

## 2012-04-16 MED ORDER — NALOXONE HCL 0.4 MG/ML IJ SOLN: 0.4000 mg | INTRAMUSCULAR | Status: DC | PRN

## 2012-04-16 MED ORDER — DEXTROSE-NACL 5-0.45 % IV SOLN
INTRAVENOUS | Status: DC
  Administered 2012-04-16 (×2): via INTRAVENOUS

## 2012-04-16 MED ORDER — KETOROLAC TROMETHAMINE 30 MG/ML IJ SOLN
INTRAMUSCULAR | Status: DC | PRN
  Administered 2012-04-16: 30 mg via INTRAVENOUS
  Administered 2012-04-16: 30 mg via INTRAMUSCULAR

## 2012-04-16 MED ORDER — MEPERIDINE HCL 25 MG/ML IJ SOLN: 6.2500 mg | INTRAMUSCULAR | Status: DC | PRN

## 2012-04-16 MED ORDER — HYDROMORPHONE HCL PF 1 MG/ML IJ SOLN
0.2500 mg | INTRAMUSCULAR | Status: DC | PRN
  Administered 2012-04-16 (×2): 0.5 mg via INTRAVENOUS

## 2012-04-16 MED ORDER — LIDOCAINE HCL (CARDIAC) 20 MG/ML IV SOLN
INTRAVENOUS | Status: DC | PRN
  Administered 2012-04-16: 20 mg via INTRAVENOUS
  Administered 2012-04-16: 50 mg via INTRAVENOUS

## 2012-04-16 MED ORDER — DEXAMETHASONE SODIUM PHOSPHATE 4 MG/ML IJ SOLN
INTRAMUSCULAR | Status: DC | PRN
  Administered 2012-04-16: 4 mg via INTRAVENOUS

## 2012-04-16 MED ORDER — PROPOFOL 10 MG/ML IV EMUL
INTRAVENOUS | Status: AC
  Filled 2012-04-16: qty 20

## 2012-04-16 MED ORDER — ALUM & MAG HYDROXIDE-SIMETH 200-200-20 MG/5ML PO SUSP: 30.0000 mL | ORAL | Status: DC | PRN

## 2012-04-16 MED ORDER — FENTANYL CITRATE 0.05 MG/ML IJ SOLN
INTRAMUSCULAR | Status: DC | PRN
  Administered 2012-04-16: 50 ug via INTRAVENOUS
  Administered 2012-04-16: 100 ug via INTRAVENOUS
  Administered 2012-04-16 (×2): 50 ug via INTRAVENOUS

## 2012-04-16 MED ORDER — KETOROLAC TROMETHAMINE 30 MG/ML IJ SOLN: 15.0000 mg | Freq: Once | INTRAMUSCULAR | Status: DC | PRN

## 2012-04-16 MED ORDER — ROCURONIUM BROMIDE 50 MG/5ML IV SOLN
INTRAVENOUS | Status: AC
  Filled 2012-04-16: qty 1

## 2012-04-16 MED ORDER — LACTATED RINGERS IV SOLN
INTRAVENOUS | Status: DC
  Administered 2012-04-16: 10:00:00 via INTRAVENOUS
  Administered 2012-04-16: 125 mL/h via INTRAVENOUS
  Administered 2012-04-16: 11:00:00 via INTRAVENOUS

## 2012-04-16 MED ORDER — DEXAMETHASONE SODIUM PHOSPHATE 10 MG/ML IJ SOLN
INTRAMUSCULAR | Status: AC
  Filled 2012-04-16: qty 1

## 2012-04-16 MED ORDER — ONDANSETRON HCL 4 MG/2ML IJ SOLN
INTRAMUSCULAR | Status: AC
  Filled 2012-04-16: qty 2

## 2012-04-16 MED ORDER — ADULT MULTIVITAMIN W/MINERALS CH
1.0000 | ORAL_TABLET | Freq: Every day | ORAL | Status: DC
  Administered 2012-04-16 – 2012-04-17 (×2): 1 via ORAL
  Filled 2012-04-16 (×2): qty 1

## 2012-04-16 MED ORDER — PREDNISONE 10 MG PO TABS: 10.0000 mg | ORAL_TABLET | ORAL | Status: DC

## 2012-04-16 MED ORDER — PROPOFOL 10 MG/ML IV EMUL
INTRAVENOUS | Status: DC | PRN
  Administered 2012-04-16: 200 mg via INTRAVENOUS

## 2012-04-16 MED ORDER — MIDAZOLAM HCL 2 MG/2ML IJ SOLN
INTRAMUSCULAR | Status: AC
  Filled 2012-04-16: qty 2

## 2012-04-16 MED ORDER — SODIUM CHLORIDE 0.9 % IJ SOLN: 9.0000 mL | INTRAMUSCULAR | Status: DC | PRN

## 2012-04-16 MED ORDER — ONDANSETRON HCL 4 MG/2ML IJ SOLN: 4.0000 mg | Freq: Four times a day (QID) | INTRAMUSCULAR | Status: DC | PRN

## 2012-04-16 MED ORDER — IBUPROFEN 800 MG PO TABS
800.0000 mg | ORAL_TABLET | Freq: Three times a day (TID) | ORAL | Status: DC | PRN
  Administered 2012-04-16 – 2012-04-17 (×2): 800 mg via ORAL
  Filled 2012-04-16 (×2): qty 1

## 2012-04-16 MED ORDER — HYDROMORPHONE 0.3 MG/ML IV SOLN
INTRAVENOUS | Status: DC
  Administered 2012-04-16: 1.5 mg via INTRAVENOUS
  Administered 2012-04-16: 14:00:00 via INTRAVENOUS
  Administered 2012-04-16: 0.6 mg via INTRAVENOUS
  Filled 2012-04-16: qty 25

## 2012-04-16 MED ORDER — CEFAZOLIN SODIUM-DEXTROSE 2-3 GM-% IV SOLR
2.0000 g | INTRAVENOUS | Status: AC
  Administered 2012-04-16: 2 g via INTRAVENOUS

## 2012-04-16 MED ORDER — MIDAZOLAM HCL 5 MG/5ML IJ SOLN
INTRAMUSCULAR | Status: DC | PRN
  Administered 2012-04-16: 2 mg via INTRAVENOUS

## 2012-04-16 MED ORDER — CALCIUM CARBONATE ANTACID 500 MG PO CHEW
1.0000 | CHEWABLE_TABLET | ORAL | Status: DC
  Administered 2012-04-16: 200 mg via ORAL
  Filled 2012-04-16: qty 1

## 2012-04-16 MED ORDER — VASOPRESSIN 20 UNIT/ML IJ SOLN
INTRAMUSCULAR | Status: AC
  Filled 2012-04-16: qty 1

## 2012-04-16 MED ORDER — DOCUSATE SODIUM 100 MG PO CAPS
100.0000 mg | ORAL_CAPSULE | Freq: Two times a day (BID) | ORAL | Status: DC
  Administered 2012-04-16 – 2012-04-17 (×3): 100 mg via ORAL
  Filled 2012-04-16 (×3): qty 1

## 2012-04-16 MED ORDER — ONDANSETRON HCL 4 MG/2ML IJ SOLN
INTRAMUSCULAR | Status: DC | PRN
  Administered 2012-04-16: 4 mg via INTRAVENOUS

## 2012-04-16 MED ORDER — DIPHENHYDRAMINE HCL 12.5 MG/5ML PO ELIX
12.5000 mg | ORAL_SOLUTION | Freq: Four times a day (QID) | ORAL | Status: DC | PRN
  Administered 2012-04-16: 12.5 mg via ORAL

## 2012-04-16 MED ORDER — BUPIVACAINE-EPINEPHRINE (PF) 0.5% -1:200000 IJ SOLN
INTRAMUSCULAR | Status: AC
  Filled 2012-04-16: qty 10

## 2012-04-16 MED ORDER — FENTANYL CITRATE 0.05 MG/ML IJ SOLN
INTRAMUSCULAR | Status: AC
  Filled 2012-04-16: qty 10

## 2012-04-16 SURGICAL SUPPLY — 28 items
CANISTER SUCTION 2500CC (MISCELLANEOUS) ×2 IMPLANT
CLOTH BEACON ORANGE TIMEOUT ST (SAFETY) ×2 IMPLANT
CONT PATH 16OZ SNAP LID 3702 (MISCELLANEOUS) IMPLANT
DECANTER SPIKE VIAL GLASS SM (MISCELLANEOUS) IMPLANT
DRAPE PROXIMA HALF (DRAPES) ×2 IMPLANT
GLOVE BIOGEL PI IND STRL 6.5 (GLOVE) ×1 IMPLANT
GLOVE BIOGEL PI IND STRL 8.5 (GLOVE) ×1 IMPLANT
GLOVE BIOGEL PI INDICATOR 6.5 (GLOVE) ×1
GLOVE BIOGEL PI INDICATOR 8.5 (GLOVE) ×1
GLOVE ECLIPSE 8.0 STRL XLNG CF (GLOVE) ×4 IMPLANT
GOWN PREVENTION PLUS LG XLONG (DISPOSABLE) ×6 IMPLANT
GOWN STRL REIN XL XLG (GOWN DISPOSABLE) ×2 IMPLANT
NDL SPNL 22GX3.5 QUINCKE BK (NEEDLE) IMPLANT
NEEDLE HYPO 22GX1.5 SAFETY (NEEDLE) ×1 IMPLANT
NEEDLE MAYO .5 CIRCLE (NEEDLE) ×2 IMPLANT
NEEDLE SPNL 22GX3.5 QUINCKE BK (NEEDLE) ×2 IMPLANT
NS IRRIG 1000ML POUR BTL (IV SOLUTION) ×2 IMPLANT
PACK VAGINAL WOMENS (CUSTOM PROCEDURE TRAY) ×2 IMPLANT
SUT CHROMIC 2 0 TIES 18 (SUTURE) IMPLANT
SUT VIC AB 0 CT1 18XCR BRD8 (SUTURE) ×3 IMPLANT
SUT VIC AB 0 CT1 27 (SUTURE) ×2
SUT VIC AB 0 CT1 27XBRD ANBCTR (SUTURE) ×1 IMPLANT
SUT VIC AB 0 CT1 8-18 (SUTURE) ×6
SUT VICRYL 0 TIES 12 18 (SUTURE) ×2 IMPLANT
SYR TB 1ML 25GX5/8 (SYRINGE) ×2 IMPLANT
TOWEL OR 17X24 6PK STRL BLUE (TOWEL DISPOSABLE) ×4 IMPLANT
TRAY FOLEY CATH 14FR (SET/KITS/TRAYS/PACK) ×2 IMPLANT
WATER STERILE IRR 1000ML POUR (IV SOLUTION) ×1 IMPLANT

## 2012-04-16 NOTE — Anesthesia Postprocedure Evaluation (Signed)
  Anesthesia Post-op Note  Patient: Sabrina Porter  Procedure(s) Performed: Procedure(s) (LRB): HYSTERECTOMY VAGINAL (N/A)  Patient Location: PACU  Anesthesia Type: General  Level of Consciousness: awake, alert  and oriented  Airway and Oxygen Therapy: Patient Spontanous Breathing  Post-op Pain: mild  Post-op Assessment: Post-op Vital signs reviewed, Patient's Cardiovascular Status Stable, Respiratory Function Stable, Patent Airway, No signs of Nausea or vomiting and Pain level controlled  Post-op Vital Signs: Reviewed and stable  Complications: No apparent anesthesia complications

## 2012-04-16 NOTE — Transfer of Care (Signed)
Immediate Anesthesia Transfer of Care Note  Patient: Sabrina Porter  Procedure(s) Performed: Procedure(s) (LRB): HYSTERECTOMY VAGINAL (N/A)  Patient Location: PACU  Anesthesia Type: General  Level of Consciousness: awake, alert  and oriented  Airway & Oxygen Therapy: Patient Spontanous Breathing and Patient connected to nasal cannula oxygen  Post-op Assessment: Report given to PACU RN and Post -op Vital signs reviewed and stable  Post vital signs: Reviewed and stable  Complications: No apparent anesthesia complications

## 2012-04-16 NOTE — Anesthesia Preprocedure Evaluation (Signed)
Anesthesia Evaluation  Patient identified by MRN, date of birth, ID band Patient awake    Reviewed: Allergy & Precautions, H&P , NPO status , Patient's Chart, lab work & pertinent test results  Airway Mallampati: II TM Distance: >3 FB Neck ROM: full    Dental No notable dental hx. (+) Teeth Intact   Pulmonary neg pulmonary ROS,    Pulmonary exam normal       Cardiovascular negative cardio ROS      Neuro/Psych negative neurological ROS  negative psych ROS   GI/Hepatic negative GI ROS, Neg liver ROS,   Endo/Other  negative endocrine ROS  Renal/GU negative Renal ROS  negative genitourinary   Musculoskeletal negative musculoskeletal ROS (+)   Abdominal Normal abdominal exam  (+)   Peds negative pediatric ROS (+)  Hematology negative hematology ROS (+)   Anesthesia Other Findings   Reproductive/Obstetrics negative OB ROS                           Anesthesia Physical Anesthesia Plan  ASA: II  Anesthesia Plan: General   Post-op Pain Management:    Induction: Intravenous  Airway Management Planned: LMA and Oral ETT  Additional Equipment:   Intra-op Plan:   Post-operative Plan: Extubation in OR  Informed Consent: I have reviewed the patients History and Physical, chart, labs and discussed the procedure including the risks, benefits and alternatives for the proposed anesthesia with the patient or authorized representative who has indicated his/her understanding and acceptance.   Dental Advisory Given  Plan Discussed with: CRNA and Surgeon  Anesthesia Plan Comments:         Anesthesia Quick Evaluation

## 2012-04-16 NOTE — Progress Notes (Signed)
[name] is a 41 y.o. year old female.  Subjective:  Doing okay. Tolerating liquids.  Objective:  BP 112/74  Pulse 83  Temp 98.1 F (36.7 C) (Oral)  Resp 20  Ht 5\' 7"  (1.702 m)  SpO2 98%   CBC    Component Value Date/Time   WBC 5.8 04/07/2012 1149   RBC 4.85 04/07/2012 1149   HGB 14.3 04/07/2012 1149   HCT 43.8 04/07/2012 1149   PLT 319 04/07/2012 1149   MCV 90.3 04/07/2012 1149   MCH 29.5 04/07/2012 1149   MCHC 32.6 04/07/2012 1149   RDW 13.7 04/07/2012 1149     Chest: Clear Heart: Regular rate and rhythm Abdomen: Soft and nontender Back: Minimal spotting  Assessment:  Doing well day of vaginal hysterectomy  Plan:  Routine care. Discharge to home tomorrow morning.  Leonard Schwartz M.D.  @today @ 7:47 PM

## 2012-04-16 NOTE — Op Note (Signed)
OPERATIVE NOTE  Sabrina Porter  DOB:    07-04-1971  MRN:    [id number]  CSN:    [id number]  Date of Surgery:  04/16/2012  Preoperative Diagnosis:  Fibroids Menorrhagia Dysmenorrhea Obesity  Postoperative Diagnosis:  Fibroids Menorrhagia Dysmenorrhea Obesity  Procedure:  Vaginal hysterectomy with uterine morcellation  Surgeon:  Leonard Schwartz, M.D.  Assistant:  Osborn Coho M.D.  Anesthetic:  General  Disposition:  The patient is a 41 year old female who presents with the above diagnosis. She understands the indications for the surgical procedure. She accepts the risk of, but not limited to, anesthetic complications, bleeding, infections, and possible damage to the surrounding organs.  Findings:  Uterus was 12 week size and it contained multiple fibroids. The right ovary was slightly adhered to the posterior uterus. The ovary was otherwise normal. The left ovary was normal. The fallopian tubes appeared normal.  Procedure:  The patient was taken to the operating room where a general anesthetic was given. The abdomen, perineum, and vagina were prepped with Betadine. A Foley catheter was placed in the bladder. The patient was sterilely draped. Examination under anesthesia was performed. The cervix was injected with Marcaine with epinephrine. A circumferential incision was made around the cervix. The vaginal mucosa was advanced anteriorly and posteriorly. The anterior cul-de-sac and the posterior cul-de-sac were sharply entered. Alternating from right to left, the uterosacral ligaments, paracervical tissues, parametrial tissues, and uterine arteries were clamped, cut, sutured, and tied securely. The uterus was inverted through the posterior colpotomy. The uterus was morcellated so that we could secure the upper pedicles. The upper pedicles were clamped and cut. The uterus was removed from the operative field. The upper pedicles were then free tied and  suture-ligated. Hemostasis was noted to be adequate. The sutures attached to the uterosacral ligaments were brought out through the vaginal angles and tied securely. A McCall culdoplasty suture was placed in the posterior cul-de-sac incorporating the posterior peritoneum and the uterosacral ligaments bilaterally. A final check was made for hemostasis and hemostasis was confirmed. The vaginal cuff was closed using figure-of-eight sutures incorporating the anterior vaginal mucosa, anterior peritoneum, posterior peritoneum, and the posterior vaginal mucosa. The McCall culdoplasty suture was tied securely and the apex of the vagina was noted to elevate into the midpelvis. Sponge, needle, and instrument counts were correct on 2 occasions. The estimated blood loss was 300 cc. The patient tolerated her procedure well. She was awakened from her anesthetic without difficulty. She was transported to the recovery room in stable condition. The uterus was sent to pathology.  Leonard Schwartz, M.D.

## 2012-04-16 NOTE — Progress Notes (Signed)
Patient received from PACU via stretcher. A/O, VSS, IV site WNL, F/C drng clear, yellow urine. Pt. denies pain. Lurline Idol [facility]

## 2012-04-16 NOTE — Anesthesia Postprocedure Evaluation (Signed)
  Anesthesia Post-op Note  Patient: Sabrina Porter  Procedure(s) Performed: Procedure(s) (LRB): HYSTERECTOMY VAGINAL (N/A)  Patient Location:313  Anesthesia Type: General  Level of Consciousness: awake, alert  and oriented  Airway and Oxygen Therapy: Patient Spontanous Breathing and Patient connected to nasal cannula oxygen  Post-op Pain: moderate, using Dilaudid PCA.  Post-op Assessment: Post-op Vital signs reviewed and Patient's Cardiovascular Status Stable  Post-op Vital Signs: Reviewed and stable  Complications: No apparent anesthesia complications

## 2012-04-16 NOTE — H&P (Signed)
The patient was interviewed and examined today.  The previously documented history and physical examination was reviewed. There are no changes. The operative procedure was reviewed. The risks and benefits were outlined again. The specific risks include, but are not limited to, anesthetic complications, bleeding, infections, and possible damage to the surrounding organs. The patient's questions were answered.  We are ready to proceed as outlined. The likelihood of the patient achieving the goals of this procedure is very likely.   Apryle Stowell Vernon Derrick Tiegs, M.D.  

## 2012-04-17 LAB — CBC
Platelets: 297 10*3/uL (ref 150–400)
RBC: 4.16 MIL/uL (ref 3.87–5.11)
RDW: 13.9 % (ref 11.5–15.5)
WBC: 11.3 10*3/uL — ABNORMAL HIGH (ref 4.0–10.5)

## 2012-04-17 MED ORDER — ONDANSETRON HCL 4 MG PO TABS: 4.0000 mg | ORAL_TABLET | Freq: Four times a day (QID) | ORAL | Status: AC | PRN

## 2012-04-17 MED ORDER — PROMETHAZINE HCL 12.5 MG PO TABS: 12.5000 mg | ORAL_TABLET | Freq: Four times a day (QID) | ORAL | Status: DC | PRN

## 2012-04-17 MED ORDER — IBUPROFEN 800 MG PO TABS: 800.0000 mg | ORAL_TABLET | Freq: Three times a day (TID) | ORAL | Status: AC | PRN

## 2012-04-17 MED ORDER — IBUPROFEN 600 MG PO TABS: ORAL_TABLET | ORAL | Status: DC

## 2012-04-17 MED ORDER — CALCIUM CARBONATE ANTACID 500 MG PO CHEW
1.0000 | CHEWABLE_TABLET | Freq: Two times a day (BID) | ORAL | Status: DC | PRN
  Administered 2012-04-17: 200 mg via ORAL
  Filled 2012-04-17: qty 1

## 2012-04-17 MED ORDER — OXYCODONE-ACETAMINOPHEN 5-325 MG PO TABS: 1.0000 | ORAL_TABLET | ORAL | Status: AC | PRN

## 2012-04-17 MED ORDER — ALUM & MAG HYDROXIDE-SIMETH 200-200-20 MG/5ML PO SUSP: 30.0000 mL | ORAL | Status: DC | PRN

## 2012-04-17 NOTE — Progress Notes (Signed)
Sabrina Porter is a40 y.o.  [id number]  Post Op Date # 1  Subjective: Patient is Doing well postoperatively. only complaining of gas discomfort and requesting TUMS. Patient has good pain control, denies nausea or dizziness.  States she's hungry but so far has only had liquids.. Hasn't voided yet and has not passed flatus.  Ambulating in room without difficulty.   Objective: Vital signs in last 24 hours: Temp:  [98.1 F (36.7 C)-98.8 F (37.1 C)] 98.3 F (36.8 C) (08/09 0600) Pulse Rate:  [73-98] 91  (08/09 0600) Resp:  [13-22] 18  (08/09 0600) BP: (99-133)/(60-84) 133/84 mmHg (08/09 0600) SpO2:  [97 %-100 %] 100 % (08/09 0600)  Intake/Output from previous day: 08/08 0701 - 08/09 0700 In: 5259.6 [P.O.:1070; I.V.:4189.6] Out: 2350 [Urine:2050] Intake/Output this shift: Total I/O In: 2139.6 [P.O.:350; I.V.:1789.6] Out: 1500 [Urine:1500] No results found for this basename: WBC:3,HGB:3,HCT:3,PLT:3 in the last 168 hours  No results found for this basename: NA:3,K:3,CL:3,CO2:3,BUN:3,CREATININE:3,CALCIUM:3,LABALBU:3,PROT:3,BILITOT:3,ALKPHOS:3,ALT:3,AST:3,GLUCOSE:3 in the last 168 hours  EXAM: General: alert and cooperative Resp: clear to auscultation bilaterally Cardio: regular rate and rhythm, S1, S2 normal, no murmur, click, rub or gallop GI: soft, appropriately tender without guarding Extremities: SCD hose in place, no calf tenderness Perineal pad with moderate dried blood.  CBC-pending  Assessment: s/p Procedure(s): HYSTERECTOMY VAGINAL: stable  Plan: Advance diet Encourage ambulation Advance to PO medication Discontinue IV fluids Discharge home  LOS: 1 day    POWELL,ELMIRA, PA-C 04/17/2012 6:14 AM   The patient was examined. I agree with the note of the physician assistant.  Dr. Stefano Gaul

## 2012-04-17 NOTE — Discharge Summary (Signed)
Physician Discharge Summary  Patient ID: Sabrina Porter MRN: [id number] DOB/AGE: 1971-07-18 41 y.o.  Admit date: 04/16/2012 Discharge date: 04/17/2012  Admission diagnosis:  Menorrhagia  Fibroids  Anemia  Hepatitis C  Discharge Diagnoses: Menorrhagia                                         Symptomatic Uterine Fibroids                                         Anemia                                         Hepatitis C                                         Hypertension                                     Operation:  Total Vaginal Hysterectomy with uterine morcellation   Discharged Condition: Stable   Hospital Course: On the date of admission the patient underwent aforementioned procedures and tolerated them well. Operative findings included a 12 week size multi-fibroid uterus. Post-operative course was unremarkable with patient resuming bowel and bladder function by post-operative day #1 so she was deemed ready for discharge home.  CBC    Component Value Date/Time   WBC 11.3* 04/17/2012 0548   RBC 4.16 04/17/2012 0548   HGB 12.2 04/17/2012 0548   HCT 38.2 04/17/2012 0548   PLT 297 04/17/2012 0548   MCV 91.8 04/17/2012 0548   MCH 29.3 04/17/2012 0548   MCHC 31.9 04/17/2012 0548   RDW 13.9 04/17/2012 0548    Disposition: 01-Home or Self Care  Discharge Medications:   Sabrina, Porter  Home Medication Instructions [id number]   Printed on:04/17/12 5284  Medication Information                    Multiple Vitamin (MULITIVITAMIN WITH MINERALS) TABS Take 1 tablet by mouth daily.             OVER THE COUNTER MEDICATION Place 1 suppository vaginally as needed. Boric Acid vaginal suppositories - As needed to correct pH balance           ergocalciferol (VITAMIN D2) 50000 UNITS capsule Take 1 capsule (50,000 Units total) by mouth once a week.           diphenhydrAMINE (BENADRYL) 25 MG tablet Take 25 mg by mouth every 6 (six) hours as needed. For allergies           VITAMIN E  PO Take 1 capsule by mouth daily.           PROTOPIC 0.1 % ointment Apply 1 application topically daily.            predniSONE (DELTASONE) 10 MG tablet Take 10 mg by mouth as directed. Taper as directed           calcium carbonate (TUMS - DOSED IN MG ELEMENTAL CALCIUM) 500 MG  chewable tablet Chew 1 tablet by mouth 2 (two) times a week.           ibuprofen (ADVIL,MOTRIN) 600 MG tablet 1 po pc q 6 hours x 5 days then prn           ondansetron (ZOFRAN) 4 MG tablet Take 1 tablet (4 mg total) by mouth every 6 (six) hours as needed for nausea.           oxyCODONE-acetaminophen (PERCOCET/ROXICET) 5-325 MG per tablet Take 1-2 tablets by mouth every 4 (four) hours as needed for pain (moderate to severe pain (when tolerating fluids)).              Follow-up: Dr. Stefano Gaul in 6 weeks  Final pathology report: Pending  Dr. Stefano Gaul   Signed: Henreitta Leber, PA-C 04/17/2012, 7:12 AM

## 2012-05-10 ENCOUNTER — Telehealth: Payer: Self-pay | Admitting: Obstetrics and Gynecology

## 2012-05-10 ENCOUNTER — Encounter (HOSPITAL_COMMUNITY): Payer: Self-pay | Admitting: Obstetrics and Gynecology

## 2012-05-10 ENCOUNTER — Inpatient Hospital Stay (HOSPITAL_COMMUNITY)
Admission: AD | Admit: 2012-05-10 | Discharge: 2012-05-10 | Disposition: A | Discharge status: Home / Self Care | Payer: Managed Care, Other (non HMO) | Source: Ambulatory Visit | Attending: Obstetrics and Gynecology | Admitting: Obstetrics and Gynecology

## 2012-05-10 DIAGNOSIS — K5900 Constipation, unspecified: Secondary | ICD-10-CM | POA: Insufficient documentation

## 2012-05-10 DIAGNOSIS — Z9889 Other specified postprocedural states: Secondary | ICD-10-CM

## 2012-05-10 DIAGNOSIS — IMO0002 Reserved for concepts with insufficient information to code with codable children: Secondary | ICD-10-CM | POA: Insufficient documentation

## 2012-05-10 DIAGNOSIS — R252 Cramp and spasm: Secondary | ICD-10-CM | POA: Insufficient documentation

## 2012-05-10 DIAGNOSIS — R1031 Right lower quadrant pain: Secondary | ICD-10-CM | POA: Insufficient documentation

## 2012-05-10 NOTE — MAU Note (Signed)
Pt reports having hysterectomy on 04/16/12. Started having vaginal bleeding today and leg cramps. Also reports constipation

## 2012-05-10 NOTE — MAU Note (Signed)
"  I had a hysterectomy on 04/16/12 for fibroids, polyps and heavy bleeding.  I started having variable d/c that were different colors since the surgery.  The actual red bleeding started about 0600 this morning.  Some cramping in RLQ and throbbing pain - like a H/A in my RT leg.  I took something for the pain, but its not as bad as it was this morning.  The constipation since the surgery.  My bowels won't move until I take a laxative.  I last took a laxative on Thursday and had a BM.  I've been taking stool softners, but eating as much because of no appetite.  I'm drinking water, but probably not as much as I should.  I haven't had intercourse since before my surgery.  I had my last period on 04/10/12 and it seem as though my period is starting."

## 2012-05-10 NOTE — Progress Notes (Signed)
History  Sabrina Porter is a 41 y.o. [id number]  Subjective: throbbing to R thigh above knee, vag bleeding like a period dime size clots less than earlier. Took some motrin it feels better, no bm x 3 days, no N,V    Chief Complaint  Patient presents with  . Vaginal Bleeding   @SFHPI @  Vitals:  Blood pressure 119/82, pulse 94, temperature 98.3 F (36.8 C), temperature source Oral, resp. rate 18, height 5\' 6"  (1.676 m), weight 175 lb 6.4 oz (79.561 kg). OB History    Grav Para Term Preterm Abortions TAB SAB Ect Mult Living   2 2 2       2       Past Medical History  Diagnosis Date  . Eczema   . Fibroid   . H/O menorrhagia   . Endometrial polyp   . Vaginitis   . HSV-2 (herpes simplex virus 2) infection   . Seasonal allergies   . Vitamin d deficiency   . SVD (spontaneous vaginal delivery)     x 2    Past Surgical History  Procedure Date  . Cystectomy     removed from chin  . Tubal ligation   . Endometrial ablation dec of 2012  . Dilation and curettage of uterus   . Cyst removed from chin     at age 29 yr old  . Vaginal hysterectomy 04/16/2012    Procedure: HYSTERECTOMY VAGINAL;  Surgeon: Kirkland Hun, MD;  Location: [facility];  Service: Gynecology;  Laterality: N/A;  Vaginal Hysterectomy 2 hours     Family History  Problem Relation Age of Onset  . Heart disease Mother   . Fibromyalgia Maternal Grandmother     History  Substance Use Topics  . Smoking status: Never Smoker   . Smokeless tobacco: Never Used  . Alcohol Use: Yes     occasionally    Allergies:  Allergies  Allergen Reactions  . Food Swelling    [place] and exotic nuts    Prescriptions prior to admission  Medication Sig Dispense Refill  . calcium carbonate (TUMS - DOSED IN MG ELEMENTAL CALCIUM) 500 MG chewable tablet Chew 1 tablet by mouth 2 (two) times a week.      . diphenhydrAMINE (BENADRYL) 25 MG tablet Take 25 mg by mouth every 6 (six) hours as needed. For allergies      . ergocalciferol  (VITAMIN D2) 50000 UNITS capsule Take 1 capsule (50,000 Units total) by mouth once a week.  20 capsule  0  . ibuprofen (ADVIL,MOTRIN) 800 MG tablet Take 800 mg by mouth every 8 (eight) hours as needed. pain      . Multiple Vitamin (MULITIVITAMIN WITH MINERALS) TABS Take 1 tablet by mouth daily.        . promethazine (PHENERGAN) 12.5 MG tablet Take 1 tablet (12.5 mg total) by mouth every 6 (six) hours as needed for nausea.  30 tablet  0  . DISCONTD: promethazine (PHENERGAN) 12.5 MG tablet Take 1 tablet (12.5 mg total) by mouth every 6 (six) hours as needed for nausea.  30 tablet  0    @ROS @ Physical Exam   Blood pressure 119/82, pulse 94, temperature 98.3 F (36.8 C), temperature source Oral, resp. rate 18, height 5\' 6"  (1.676 m), weight 175 lb 6.4 oz (79.561 kg).  @PHYSEXAMBYAGE2 @ Labs:  No results found for this or any previous visit (from the past 24 hour(s)).   I have reviewed the patient's current medications.  ASSESSMENT: Patient Active Problem List  Diagnosis  . Herpesvirus 2  . Fibroids  . Irregular periods/menstrual cycles  . Pelvic pain  . Hx of hepatitis C  . Glucosuria  . Overweight  . Human papilloma virus  . ASCUS (atypical squamous cells of undetermined significance) on Pap smear  . S/P endometrial ablation  . S/P tubal ligation  . Dysmenorrhea   Physical Examination:  General appearance - alert, well appearing, and in no distress Physical exam: Calm, no distress, HEENT wnl lungs clear bilaterally, AP RRR, abd soft, gravid, nt, bowel sounds active, abdomen nontender Normal hair distrubition mons pubis,  EGBUS WNL, sterile speculum exam,  vagina pink small amount red blood on swap and 1/4 cm clot noted nt on vag digital exam, no masses No edema to lower extremities, -Homans sign bilaterally  No ecchymosis   [facility] ED Course  Assessment/Plan Constipation post op 3 weeks Vaginal bleeding Enema given good results, discussed constipation relief measures,  miralax, 8 water daily, ambulation, f/o as scheduled collaboration with Dr. Normand Sloop per telephone. Lavera Guise, CNM

## 2012-05-10 NOTE — Telephone Encounter (Signed)
Has had o intercouse, drinks little water, discussed constipation measures 8 water daily, miralax 1-5 daily, glycerine suppository, if without results to day come in to [facility] for enema, if bleeding becomes heavier come in for evaluation to [facility], call if concerns. Lavera Guise, CNM

## 2012-05-25 ENCOUNTER — Ambulatory Visit (INDEPENDENT_AMBULATORY_CARE_PROVIDER_SITE_OTHER): Payer: Private Health Insurance - Indemnity | Admitting: Obstetrics and Gynecology

## 2012-05-25 ENCOUNTER — Encounter: Payer: Self-pay | Admitting: Obstetrics and Gynecology

## 2012-05-25 VITALS — BP 110/70 | Temp 98.2°F | Resp 16 | Ht 66.0 in | Wt 174.0 lb

## 2012-05-25 DIAGNOSIS — N898 Other specified noninflammatory disorders of vagina: Secondary | ICD-10-CM

## 2012-05-25 DIAGNOSIS — D219 Benign neoplasm of connective and other soft tissue, unspecified: Secondary | ICD-10-CM

## 2012-05-25 DIAGNOSIS — D259 Leiomyoma of uterus, unspecified: Secondary | ICD-10-CM

## 2012-05-25 LAB — POCT WET PREP (WET MOUNT)
Whiff Test: NEGATIVE
pH: 5.5

## 2012-05-25 NOTE — Progress Notes (Signed)
DATE OF SURGERY: 04/16/2012 TYPE OF SURGERY:HYSTRERCTOMY PAIN:Yes Cramping VAG BLEEDING: no VAG DISCHARGE: yes NORMAL GI FUNCTN: no "Constipation" NORMAL GU FUNCTN: yes  Odor: yes Fever: no Pelvic Pain: yes  Itching: yes Dyspareunia: no Desires GC/CT: no  Thin: yes History of PID: no Desires HIV,RPR,HbsAG: no  Thick: no History of STD: yes Other: N/A   HISTORY OF PRESENT ILLNESS  [name] is a 41 y.o. year old female,[id number], who presents for a postoperative visit.  The patient had a vaginal hysterectomy on August 8 of 2013.  The path report showed benign fibroids and adenomyosis.  Subjective:  She complains of a vaginal discharge with itching.  Her constipation has resolved.  Objective:  BP 110/70  Temp 98.2 F (36.8 C) (Oral)  Resp 16  Ht 5\' 6"  (1.676 m)  Wt 174 lb (78.926 kg)  BMI 28.08 kg/m2  LMP 04/06/2012   GI: soft and nontender  Vulva and vagina appear normal. Bimanual exam reveals normal uterus and adnexa. External genitalia: normal general appearance Vaginal: normal without tenderness, induration or masses and healed cuff Adnexa: normal bimanual exam  Wet prep: PH 5.5. Whiff -.  No yeast, treated, or clue cells.  Assessment:  Doing well status post vaginal hysterectomy for fibroids and adenomyosis. Vaginal discharge.  Plan:  Return to work on May 28, 2012. Use boric acid suppositories as needed. Return to normal activities.  Return to office in 7 month(s).   Leonard Schwartz M.D.  05/25/2012 9:13 AM

## 2012-08-03 ENCOUNTER — Encounter (HOSPITAL_COMMUNITY): Payer: Self-pay | Admitting: *Deleted

## 2012-08-03 ENCOUNTER — Inpatient Hospital Stay (HOSPITAL_COMMUNITY)
Admission: AD | Admit: 2012-08-03 | Discharge: 2012-08-04 | Disposition: A | Discharge status: Home / Self Care | Payer: Private Health Insurance - Indemnity | Source: Ambulatory Visit | Attending: Obstetrics and Gynecology | Admitting: Obstetrics and Gynecology

## 2012-08-03 DIAGNOSIS — R109 Unspecified abdominal pain: Secondary | ICD-10-CM | POA: Insufficient documentation

## 2012-08-03 DIAGNOSIS — Z90710 Acquired absence of both cervix and uterus: Secondary | ICD-10-CM | POA: Insufficient documentation

## 2012-08-03 DIAGNOSIS — N949 Unspecified condition associated with female genital organs and menstrual cycle: Secondary | ICD-10-CM | POA: Insufficient documentation

## 2012-08-03 DIAGNOSIS — R1010 Upper abdominal pain, unspecified: Secondary | ICD-10-CM

## 2012-08-03 DIAGNOSIS — Z113 Encounter for screening for infections with a predominantly sexual mode of transmission: Secondary | ICD-10-CM

## 2012-08-03 LAB — URINALYSIS, ROUTINE W REFLEX MICROSCOPIC
Bilirubin Urine: NEGATIVE
Nitrite: NEGATIVE
Specific Gravity, Urine: 1.025 (ref 1.005–1.030)
Urobilinogen, UA: 0.2 mg/dL (ref 0.0–1.0)

## 2012-08-03 LAB — URINE MICROSCOPIC-ADD ON

## 2012-08-03 NOTE — MAU Note (Signed)
Pt post hysterectomy in September 2013.  Having upper abd pain x 1 wk.  Vaginal discharge since hysterectomy.  Feels like she needs STD testing.

## 2012-08-04 LAB — WET PREP, GENITAL
Clue Cells Wet Prep HPF POC: NONE SEEN
Trich, Wet Prep: NONE SEEN

## 2012-08-04 LAB — COMPREHENSIVE METABOLIC PANEL
AST: 17 U/L (ref 0–37)
Albumin: 3.5 g/dL (ref 3.5–5.2)
Calcium: 9.4 mg/dL (ref 8.4–10.5)
Creatinine, Ser: 0.69 mg/dL (ref 0.50–1.10)
Sodium: 136 mEq/L (ref 135–145)

## 2012-08-04 LAB — CBC
MCH: 30.2 pg (ref 26.0–34.0)
MCV: 89.4 fL (ref 78.0–100.0)
Platelets: 264 10*3/uL (ref 150–400)
RDW: 12.9 % (ref 11.5–15.5)
WBC: 7 10*3/uL (ref 4.0–10.5)

## 2012-08-04 LAB — RPR: RPR Ser Ql: NONREACTIVE

## 2012-08-04 LAB — RAPID HIV SCREEN (WH-MAU): Rapid HIV Screen: NONREACTIVE

## 2012-10-20 ENCOUNTER — Encounter: Payer: Private Health Insurance - Indemnity | Admitting: Obstetrics and Gynecology

## 2012-10-23 ENCOUNTER — Ambulatory Visit: Payer: Private Health Insurance - Indemnity | Admitting: Obstetrics and Gynecology

## 2012-10-23 ENCOUNTER — Other Ambulatory Visit: Payer: Self-pay | Admitting: Obstetrics and Gynecology

## 2012-10-23 ENCOUNTER — Encounter: Payer: Self-pay | Admitting: Obstetrics and Gynecology

## 2012-10-23 VITALS — BP 110/68 | Temp 98.2°F | Wt 182.0 lb

## 2012-10-23 DIAGNOSIS — N898 Other specified noninflammatory disorders of vagina: Secondary | ICD-10-CM

## 2012-10-23 DIAGNOSIS — Z113 Encounter for screening for infections with a predominantly sexual mode of transmission: Secondary | ICD-10-CM

## 2012-10-23 DIAGNOSIS — R102 Pelvic and perineal pain: Secondary | ICD-10-CM

## 2012-10-23 DIAGNOSIS — E559 Vitamin D deficiency, unspecified: Secondary | ICD-10-CM

## 2012-10-23 LAB — POCT WET PREP (WET MOUNT)

## 2012-10-23 LAB — POCT URINALYSIS DIPSTICK
Blood, UA: NEGATIVE
Ketones, UA: NEGATIVE
Protein, UA: NEGATIVE
Spec Grav, UA: 1.025

## 2012-10-23 LAB — POCT URINE PREGNANCY: Preg Test, Ur: NEGATIVE

## 2012-10-23 MED ORDER — FLUCONAZOLE 150 MG PO TABS: 150.0000 mg | ORAL_TABLET | Freq: Once | ORAL | Status: AC

## 2012-10-23 MED ORDER — METRONIDAZOLE 500 MG PO TABS: ORAL_TABLET | ORAL | Status: DC

## 2012-10-23 NOTE — Progress Notes (Signed)
Vaginal discharge: light tan watery thick at times  Itching / Burning: sometimes itching no burning  Fever: no  Symptoms have been present since partial hyst. Sept. 2013. No relief with Boric Acid inserts. Has used over-the-counter treatment: no Associated symptoms:  Pelvic pain: yes       Dyspareunia: no     Odor:  yes  History of STD:  Years ago Chlamydia  STD screen:requested  Vit D check. Pt also states she didn't scheduled the 12 wk vit D recheck appointment.

## 2012-10-23 NOTE — Progress Notes (Signed)
41 YO reports vaginal discharge that is watery-thick and beige in color. Occasionally has itching of vaginal area with burning all present since hysterectomy in September 2013.   Patient also needs a follow up Vitamin D 25-H.  States that discharge is present all of the times and is very wet.  Other symptoms are only occasional.  Patient also wants STD testing but has voided a clean catch urine within the hour.  States she's had a normal test within the past month.  O: Pelvic: EGBUS-wnl, vagina-normal, uterus/cervix surgically absent.  Wet Prep:   pH-5.5,  whiff-positive, few clue cells  A: BV-chronic vs recurrence     Vitamin D Defciency  P:  Vitamin D 25-H-pending      Metronidazole 500 mg #26 1 po bid x 7 days then 1 po pc weekly x 12 weeks no refills      Diflucan 150 mg #1 1 po stat 1 refill      To consult Dr. Stefano Gaul for ideas about decreasing recurrence as patient states that the Boric     Acid Capsules do not work.      RTO- as scheduled or prn  Jacksen Isip, PA-C

## 2012-10-26 ENCOUNTER — Telehealth: Payer: Self-pay

## 2012-10-26 MED ORDER — VITAMIN D3 1.25 MG (50000 UT) PO CAPS: 1.0000 | ORAL_CAPSULE | ORAL | Status: AC

## 2012-10-26 NOTE — Telephone Encounter (Signed)
Vitamin D 50,000 po once weekly x 12 weeks.  LVM to advise pt. Rx sent to pharmacy.   Darien Ramus, CMA

## 2012-10-26 NOTE — Telephone Encounter (Signed)
Pt advised  Charlestine Rookstool, CMA  

## 2012-10-26 NOTE — Telephone Encounter (Signed)
Message copied by Darien Ramus on Mon Oct 26, 2012  9:35 AM ------      Message from: Henreitta Leber      Created: Sun Oct 25, 2012  9:03 PM       Patient need Vitamin D Protocol and follow up labs after treatment.  Once she's finished her treatment she should take Vitamin D 1000 units daily to maintain her level. Thanks,  EP ------

## 2013-11-29 ENCOUNTER — Other Ambulatory Visit: Payer: Self-pay

## 2013-11-29 DIAGNOSIS — Z1231 Encounter for screening mammogram for malignant neoplasm of breast: Secondary | ICD-10-CM

## 2013-12-13 ENCOUNTER — Ambulatory Visit
Admission: RE | Admit: 2013-12-13 | Discharge: 2013-12-13 | Disposition: A | Discharge status: Home / Self Care | Payer: Private Health Insurance - Indemnity | Source: Ambulatory Visit

## 2013-12-13 DIAGNOSIS — Z1231 Encounter for screening mammogram for malignant neoplasm of breast: Secondary | ICD-10-CM

## 2014-07-11 ENCOUNTER — Encounter: Payer: Self-pay | Admitting: Obstetrics and Gynecology

## 2014-08-10 ENCOUNTER — Encounter (HOSPITAL_COMMUNITY): Payer: Self-pay | Admitting: *Deleted

## 2014-08-10 ENCOUNTER — Emergency Department (INDEPENDENT_AMBULATORY_CARE_PROVIDER_SITE_OTHER)
Admission: EM | Admit: 2014-08-10 | Discharge: 2014-08-10 | Disposition: A | Discharge status: Home / Self Care | Payer: Self-pay | Source: Home / Self Care | Attending: Emergency Medicine | Admitting: Emergency Medicine

## 2014-08-10 DIAGNOSIS — M542 Cervicalgia: Secondary | ICD-10-CM

## 2014-08-10 DIAGNOSIS — M79601 Pain in right arm: Secondary | ICD-10-CM
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
## 2015-09-07 ENCOUNTER — Ambulatory Visit
Admission: RE | Admit: 2015-09-07 | Discharge: 2015-09-07 | Disposition: A | Discharge status: Home / Self Care | Payer: BLUE CROSS/BLUE SHIELD | Source: Ambulatory Visit

## 2015-09-07 ENCOUNTER — Other Ambulatory Visit: Payer: Self-pay

## 2015-09-07 DIAGNOSIS — Z1231 Encounter for screening mammogram for malignant neoplasm of breast: Secondary | ICD-10-CM

## 2015-09-13 ENCOUNTER — Other Ambulatory Visit: Payer: Self-pay | Admitting: Obstetrics and Gynecology

## 2015-09-13 DIAGNOSIS — R928 Other abnormal and inconclusive findings on diagnostic imaging of breast: Secondary | ICD-10-CM

## 2015-09-18 ENCOUNTER — Ambulatory Visit
Admission: RE | Admit: 2015-09-18 | Discharge: 2015-09-18 | Disposition: A | Discharge status: Home / Self Care | Payer: BLUE CROSS/BLUE SHIELD | Source: Ambulatory Visit | Attending: Obstetrics and Gynecology | Admitting: Obstetrics and Gynecology

## 2015-09-18 DIAGNOSIS — R928 Other abnormal and inconclusive findings on diagnostic imaging of breast: Secondary | ICD-10-CM

## 2017-07-04 ENCOUNTER — Other Ambulatory Visit: Payer: Self-pay | Admitting: Obstetrics and Gynecology

## 2017-07-04 DIAGNOSIS — Z1231 Encounter for screening mammogram for malignant neoplasm of breast: Secondary | ICD-10-CM

## 2017-07-25 ENCOUNTER — Ambulatory Visit: Payer: BLUE CROSS/BLUE SHIELD

## 2017-08-08 ENCOUNTER — Ambulatory Visit
Admission: RE | Admit: 2017-08-08 | Discharge: 2017-08-08 | Disposition: A | Discharge status: Home / Self Care | Payer: BLUE CROSS/BLUE SHIELD | Source: Ambulatory Visit | Attending: Obstetrics and Gynecology | Admitting: Obstetrics and Gynecology

## 2017-08-08 DIAGNOSIS — Z1231 Encounter for screening mammogram for malignant neoplasm of breast: Secondary | ICD-10-CM

## 2018-10-05 ENCOUNTER — Other Ambulatory Visit: Payer: Self-pay | Admitting: Obstetrics and Gynecology

## 2018-10-05 DIAGNOSIS — Z1231 Encounter for screening mammogram for malignant neoplasm of breast: Secondary | ICD-10-CM

## 2018-10-30 ENCOUNTER — Ambulatory Visit: Payer: BLUE CROSS/BLUE SHIELD

## 2018-11-23 ENCOUNTER — Ambulatory Visit: Payer: BLUE CROSS/BLUE SHIELD

## 2018-12-21 ENCOUNTER — Ambulatory Visit: Payer: BLUE CROSS/BLUE SHIELD

## 2019-02-04 ENCOUNTER — Ambulatory Visit
Admission: RE | Admit: 2019-02-04 | Discharge: 2019-02-04 | Disposition: A | Discharge status: Home / Self Care | Payer: BLUE CROSS/BLUE SHIELD | Source: Ambulatory Visit | Attending: Obstetrics and Gynecology | Admitting: Obstetrics and Gynecology

## 2019-02-04 ENCOUNTER — Other Ambulatory Visit: Payer: Self-pay

## 2019-02-04 DIAGNOSIS — Z1231 Encounter for screening mammogram for malignant neoplasm of breast: Secondary | ICD-10-CM

## 2019-10-18 DIAGNOSIS — N393 Stress incontinence (female) (male): Secondary | ICD-10-CM | POA: Diagnosis not present

## 2019-10-18 DIAGNOSIS — Z01419 Encounter for gynecological examination (general) (routine) without abnormal findings: Secondary | ICD-10-CM | POA: Diagnosis not present

## 2019-10-18 DIAGNOSIS — Z6828 Body mass index (BMI) 28.0-28.9, adult: Secondary | ICD-10-CM | POA: Diagnosis not present

## 2019-10-18 DIAGNOSIS — Z0000 Encounter for general adult medical examination without abnormal findings: Secondary | ICD-10-CM | POA: Diagnosis not present

## 2019-10-18 DIAGNOSIS — N951 Menopausal and female climacteric states: Secondary | ICD-10-CM | POA: Diagnosis not present

## 2019-10-18 DIAGNOSIS — N898 Other specified noninflammatory disorders of vagina: Secondary | ICD-10-CM | POA: Diagnosis not present

## 2019-11-02 DIAGNOSIS — R140 Abdominal distension (gaseous): Secondary | ICD-10-CM | POA: Diagnosis not present

## 2019-11-02 DIAGNOSIS — K5904 Chronic idiopathic constipation: Secondary | ICD-10-CM | POA: Diagnosis not present

## 2019-11-02 DIAGNOSIS — Z1211 Encounter for screening for malignant neoplasm of colon: Secondary | ICD-10-CM | POA: Diagnosis not present

## 2019-11-02 DIAGNOSIS — K219 Gastro-esophageal reflux disease without esophagitis: Secondary | ICD-10-CM | POA: Diagnosis not present

## 2019-11-24 DIAGNOSIS — D122 Benign neoplasm of ascending colon: Secondary | ICD-10-CM | POA: Diagnosis not present

## 2019-11-24 DIAGNOSIS — Z1211 Encounter for screening for malignant neoplasm of colon: Secondary | ICD-10-CM | POA: Diagnosis not present

## 2019-11-24 DIAGNOSIS — K635 Polyp of colon: Secondary | ICD-10-CM | POA: Diagnosis not present

## 2020-02-14 ENCOUNTER — Other Ambulatory Visit: Payer: Self-pay | Admitting: Obstetrics and Gynecology

## 2020-02-14 DIAGNOSIS — Z1231 Encounter for screening mammogram for malignant neoplasm of breast: Secondary | ICD-10-CM

## 2020-03-03 ENCOUNTER — Other Ambulatory Visit: Payer: Self-pay

## 2020-03-03 ENCOUNTER — Ambulatory Visit
Admission: RE | Admit: 2020-03-03 | Discharge: 2020-03-03 | Disposition: A | Discharge status: Home / Self Care | Payer: BC Managed Care – PPO | Source: Ambulatory Visit | Attending: Obstetrics and Gynecology | Admitting: Obstetrics and Gynecology

## 2020-03-03 DIAGNOSIS — Z1231 Encounter for screening mammogram for malignant neoplasm of breast: Secondary | ICD-10-CM | POA: Diagnosis not present

## 2020-04-29 DIAGNOSIS — Z20822 Contact with and (suspected) exposure to covid-19: Secondary | ICD-10-CM | POA: Diagnosis not present

## 2020-07-18 DIAGNOSIS — R102 Pelvic and perineal pain: Secondary | ICD-10-CM | POA: Diagnosis not present

## 2020-08-29 DIAGNOSIS — M25552 Pain in left hip: Secondary | ICD-10-CM | POA: Diagnosis not present

## 2020-08-29 DIAGNOSIS — M62838 Other muscle spasm: Secondary | ICD-10-CM | POA: Diagnosis not present

## 2020-09-22 DIAGNOSIS — Z20822 Contact with and (suspected) exposure to covid-19: Secondary | ICD-10-CM | POA: Diagnosis not present

## 2020-10-01 DIAGNOSIS — U071 COVID-19: Secondary | ICD-10-CM | POA: Diagnosis not present

## 2020-10-01 DIAGNOSIS — Z20822 Contact with and (suspected) exposure to covid-19: Secondary | ICD-10-CM | POA: Diagnosis not present

## 2020-10-10 DIAGNOSIS — Z20822 Contact with and (suspected) exposure to covid-19: Secondary | ICD-10-CM | POA: Diagnosis not present

## 2020-10-28 DIAGNOSIS — Z6828 Body mass index (BMI) 28.0-28.9, adult: Secondary | ICD-10-CM | POA: Diagnosis not present

## 2020-10-28 DIAGNOSIS — Z713 Dietary counseling and surveillance: Secondary | ICD-10-CM | POA: Diagnosis not present

## 2020-10-28 DIAGNOSIS — Z13220 Encounter for screening for lipoid disorders: Secondary | ICD-10-CM | POA: Diagnosis not present

## 2021-01-15 DIAGNOSIS — Z0000 Encounter for general adult medical examination without abnormal findings: Secondary | ICD-10-CM | POA: Diagnosis not present

## 2021-01-15 DIAGNOSIS — E559 Vitamin D deficiency, unspecified: Secondary | ICD-10-CM | POA: Diagnosis not present

## 2021-01-15 DIAGNOSIS — N898 Other specified noninflammatory disorders of vagina: Secondary | ICD-10-CM | POA: Diagnosis not present

## 2021-01-15 DIAGNOSIS — E785 Hyperlipidemia, unspecified: Secondary | ICD-10-CM | POA: Diagnosis not present

## 2021-01-15 DIAGNOSIS — Z6828 Body mass index (BMI) 28.0-28.9, adult: Secondary | ICD-10-CM | POA: Diagnosis not present

## 2021-01-15 DIAGNOSIS — Z01419 Encounter for gynecological examination (general) (routine) without abnormal findings: Secondary | ICD-10-CM | POA: Diagnosis not present

## 2021-03-13 DIAGNOSIS — Z1231 Encounter for screening mammogram for malignant neoplasm of breast: Secondary | ICD-10-CM | POA: Diagnosis not present

## 2021-03-14 ENCOUNTER — Ambulatory Visit: Payer: BC Managed Care – PPO | Admitting: Family Medicine

## 2021-03-21 ENCOUNTER — Ambulatory Visit: Payer: BC Managed Care – PPO | Admitting: Family Medicine

## 2021-03-28 ENCOUNTER — Ambulatory Visit: Payer: BC Managed Care – PPO | Admitting: Family Medicine

## 2021-03-28 ENCOUNTER — Other Ambulatory Visit: Payer: Self-pay

## 2021-03-28 VITALS — Ht 67.0 in | Wt 180.0 lb

## 2021-03-28 DIAGNOSIS — M722 Plantar fascial fibromatosis: Secondary | ICD-10-CM

## 2021-03-28 DIAGNOSIS — M25559 Pain in unspecified hip: Secondary | ICD-10-CM

## 2021-03-28 DIAGNOSIS — M545 Low back pain, unspecified: Secondary | ICD-10-CM

## 2021-03-28 NOTE — Patient Instructions (Signed)
Your back and hip pain are due to a combination of issues related to each other: low back strain (lat, posture muscles), gluteal strain, SI joint dysfunction. Start physical therapy and do home exercises and stretches on days you don't go. Heat 15 minutes at a time if needed. Ibuprofen 600mg  three times a day as needed for pain and inflammation. Follow up with me in 5-6 weeks for reevaluation.  You have plantar fasciitis Take tylenol and/or aleve as needed for pain  Plantar fascia stretch for 20-30 seconds (do 3 of these) in morning Lowering/raise on a step exercises 3 x 10 once or twice a day - this is very important for long term recovery. Ice heel for 15 minutes as needed. Avoid flat shoes/barefoot walking as much as possible. Arch straps have been shown to help with pain. Inserts are important (dr. Zoe Lan active series, spencos, superfeet but try the ones you just ordered). Steroid injection is a consideration for short term pain relief if you are struggling. Physical therapy is also an option.

## 2021-03-29 ENCOUNTER — Encounter: Payer: Self-pay | Admitting: Family Medicine

## 2021-03-29 NOTE — Progress Notes (Signed)
PCP: Wallene Huh, MD  Subjective:   HPI: Patient is a 50 y.o. female here for right heel, left hip/back pain.  Patient reports for several months she's had current issues. Had right plantar foot pain that has improved some and localized more to the posterior heel now. Ibuprofen helped with this. Tried different inserts and has some on the way as well. Her low back/left hip is bothering her more though. Pain to lateral aspect of low back. No radiation down the leg. No numbness or tingling. No bowel/bladder dysfunction. Has not tried anything for this except ibuprofen to date.  Past Medical History:  Diagnosis Date   Eczema    Endometrial polyp    Fibroid    H/O menorrhagia    HSV-2 (herpes simplex virus 2) infection    Seasonal allergies    SVD (spontaneous vaginal delivery)    x 2   Vaginitis    Vitamin D deficiency     Current Outpatient Medications on File Prior to Visit  Medication Sig Dispense Refill   betamethasone dipropionate (DIPROLENE) 0.05 % cream Apply topically 2 (two) times daily.     calcium carbonate (TUMS - DOSED IN MG ELEMENTAL CALCIUM) 500 MG chewable tablet Chew 1 tablet by mouth 2 (two) times a week.     Cholecalciferol (VITAMIN D3) 50000 UNITS CAPS Take 1 capsule by mouth once a week. 20 capsule 0   diphenhydrAMINE (BENADRYL) 25 MG tablet Take 25 mg by mouth every 6 (six) hours as needed. For allergies     fluconazole (DIFLUCAN) 150 MG tablet Take 1 tablet (150 mg total) by mouth once. 1 tablet 1   Multiple Vitamin (MULITIVITAMIN WITH MINERALS) TABS Take 1 tablet by mouth daily.       Vitamin D, Ergocalciferol, (DRISDOL) 1.25 MG (50000 UNIT) CAPS capsule Take 50,000 Units by mouth once a week.     No current facility-administered medications on file prior to visit.    Past Surgical History:  Procedure Laterality Date   cyst removed from chin     at age 72 yr old   CYSTECTOMY     removed from chin   [place]  dec of 2012   TUBAL LIGATION     VAGINAL HYSTERECTOMY  04/16/2012   Procedure: HYSTERECTOMY VAGINAL;  Surgeon: Ena Dawley, MD;  Location: [facility];  Service: Gynecology;  Laterality: N/A;  Vaginal Hysterectomy 2 hours     Allergies  Allergen Reactions   Food Swelling    [place] and exotic nuts    Social History   Socioeconomic History   Marital status: Single    Spouse name: Not on file   Number of children: Not on file   Years of education: Not on file   Highest education level: Not on file  Occupational History   Not on file  Tobacco Use   Smoking status: Never   Smokeless tobacco: Never  Substance and Sexual Activity   Alcohol use: Yes    Comment: occasionally   Drug use: No   Sexual activity: Yes    Partners: Male    Birth control/protection: Surgical    Comment: partial hysterectomy   Other Topics Concern   Not on file  Social History Narrative   Not on file   Social Determinants of Health   Financial Resource Strain: Not on file  Food Insecurity: Not on file  Transportation Needs: Not on file  Physical Activity: Not on  file  Stress: Not on file  Social Connections: Not on file  Intimate Partner Violence: Not on file    Family History  Problem Relation Age of Onset   Heart disease Mother    Fibromyalgia Maternal Grandmother    BRCA 1/2 Neg Hx    Breast cancer Neg Hx     Ht _0  (1.702 m)   Wt 180 lb (81.6 kg)   LMP 04/06/2012   BMI 28.19 kg/m   No flowsheet data found.  No flowsheet data found.  Review of Systems: See HPI above.     Objective:  Physical Exam:  Gen: NAD, comfortable in exam room  Right foot/ankle: No gross deformity, swelling, ecchymoses FROM TTP posterior aspect of plantar fascia medially Negative ant drawer and negative talar tilt.   Negative syndesmotic compression. Negative calcaneal squeeze. Thompsons test negative. NV intact distally.  Back/left hip: No gross deformity,  scoliosis. TTP left paraspinal lumbar region, gluteal muscles, less over proximal IT band.  No midline or bony TTP. FROM. Strength LEs 5/5 all muscle groups.   Negative SLRs. Sensation intact to light touch bilaterally. Negative logroll Negative faber but more tightness on left than right.  Negative fadir and piriformis stretches.    Assessment & Plan:  1. Right plantar fasciitis - exam reassuring.  Home exercises and stretches reviewed.  Arch support.  Icing, tylenol, ibuprofen or aleve.  Consider formal physical therapy.  2. Left low back/hip pain - combination of issues including low back strain, gluteal muscle strain, SI joint dysfunction.  Start formal physical therapy and home exercises.  Heat, ibuprofen.  F/u in 5-6 weeks.

## 2021-05-02 ENCOUNTER — Ambulatory Visit: Payer: BC Managed Care – PPO | Admitting: Family Medicine

## 2021-11-01 DIAGNOSIS — M545 Low back pain, unspecified: Secondary | ICD-10-CM | POA: Diagnosis not present

## 2021-11-01 DIAGNOSIS — Z833 Family history of diabetes mellitus: Secondary | ICD-10-CM | POA: Diagnosis not present

## 2021-11-01 DIAGNOSIS — R030 Elevated blood-pressure reading, without diagnosis of hypertension: Secondary | ICD-10-CM | POA: Diagnosis not present

## 2021-11-01 DIAGNOSIS — E559 Vitamin D deficiency, unspecified: Secondary | ICD-10-CM | POA: Diagnosis not present

## 2021-11-22 DIAGNOSIS — M545 Low back pain, unspecified: Secondary | ICD-10-CM | POA: Diagnosis not present

## 2021-12-11 DIAGNOSIS — R030 Elevated blood-pressure reading, without diagnosis of hypertension: Secondary | ICD-10-CM | POA: Diagnosis not present

## 2021-12-11 DIAGNOSIS — E669 Obesity, unspecified: Secondary | ICD-10-CM | POA: Diagnosis not present

## 2021-12-24 DIAGNOSIS — L2084 Intrinsic (allergic) eczema: Secondary | ICD-10-CM | POA: Diagnosis not present

## 2021-12-24 DIAGNOSIS — L219 Seborrheic dermatitis, unspecified: Secondary | ICD-10-CM | POA: Diagnosis not present

## 2021-12-24 DIAGNOSIS — L669 Cicatricial alopecia, unspecified: Secondary | ICD-10-CM | POA: Diagnosis not present

## 2021-12-24 DIAGNOSIS — L089 Local infection of the skin and subcutaneous tissue, unspecified: Secondary | ICD-10-CM | POA: Diagnosis not present

## 2022-02-11 DIAGNOSIS — N898 Other specified noninflammatory disorders of vagina: Secondary | ICD-10-CM | POA: Diagnosis not present

## 2022-02-11 DIAGNOSIS — Z01419 Encounter for gynecological examination (general) (routine) without abnormal findings: Secondary | ICD-10-CM | POA: Diagnosis not present

## 2022-02-11 DIAGNOSIS — R399 Unspecified symptoms and signs involving the genitourinary system: Secondary | ICD-10-CM | POA: Diagnosis not present

## 2022-02-11 DIAGNOSIS — Z124 Encounter for screening for malignant neoplasm of cervix: Secondary | ICD-10-CM | POA: Diagnosis not present

## 2022-02-11 DIAGNOSIS — Z1211 Encounter for screening for malignant neoplasm of colon: Secondary | ICD-10-CM | POA: Diagnosis not present

## 2022-02-11 DIAGNOSIS — Z1231 Encounter for screening mammogram for malignant neoplasm of breast: Secondary | ICD-10-CM | POA: Diagnosis not present

## 2022-02-12 DIAGNOSIS — Z23 Encounter for immunization: Secondary | ICD-10-CM | POA: Diagnosis not present

## 2022-02-12 DIAGNOSIS — M545 Low back pain, unspecified: Secondary | ICD-10-CM | POA: Diagnosis not present

## 2022-02-12 DIAGNOSIS — Z0000 Encounter for general adult medical examination without abnormal findings: Secondary | ICD-10-CM | POA: Diagnosis not present

## 2022-02-12 DIAGNOSIS — E7800 Pure hypercholesterolemia, unspecified: Secondary | ICD-10-CM | POA: Diagnosis not present

## 2022-02-12 DIAGNOSIS — E669 Obesity, unspecified: Secondary | ICD-10-CM | POA: Diagnosis not present

## 2022-02-12 DIAGNOSIS — K219 Gastro-esophageal reflux disease without esophagitis: Secondary | ICD-10-CM | POA: Diagnosis not present

## 2022-02-13 ENCOUNTER — Other Ambulatory Visit: Payer: Self-pay | Admitting: Internal Medicine

## 2022-02-13 DIAGNOSIS — Z8249 Family history of ischemic heart disease and other diseases of the circulatory system: Secondary | ICD-10-CM

## 2022-03-15 ENCOUNTER — Other Ambulatory Visit: Payer: BC Managed Care – PPO

## 2022-05-05 IMAGING — MG DIGITAL SCREENING BILAT W/ TOMO W/ CAD
8 series · 8 of 24 positions shown · non-contrast
Comparison: Previous exam(s).

CLINICAL DATA: Screening.

EXAM:
DIGITAL SCREENING BILATERAL MAMMOGRAM WITH TOMO AND CAD

[L MLO synth-2D]
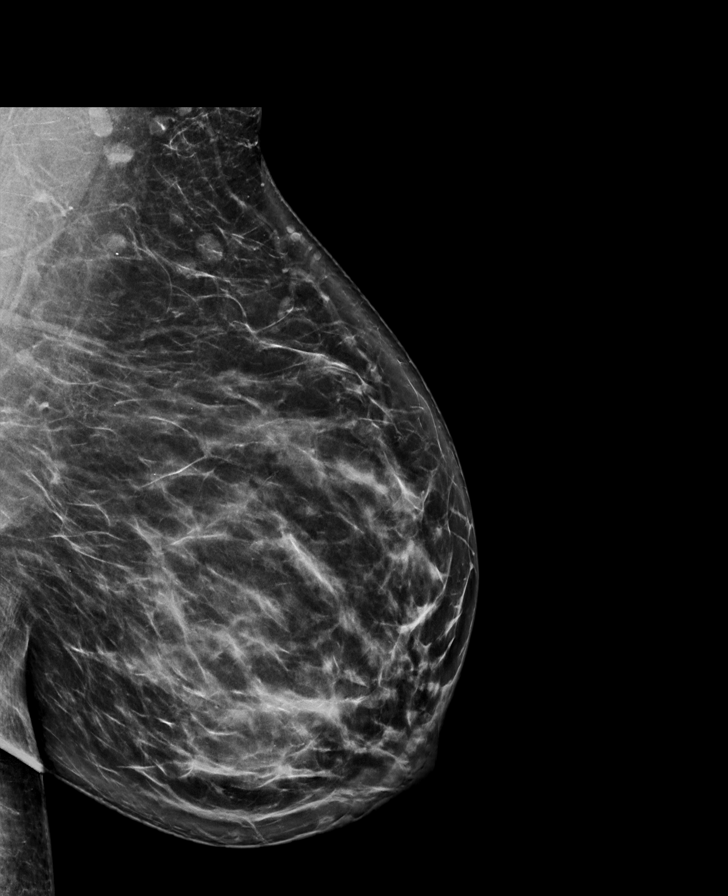

[L CC synth-2D]
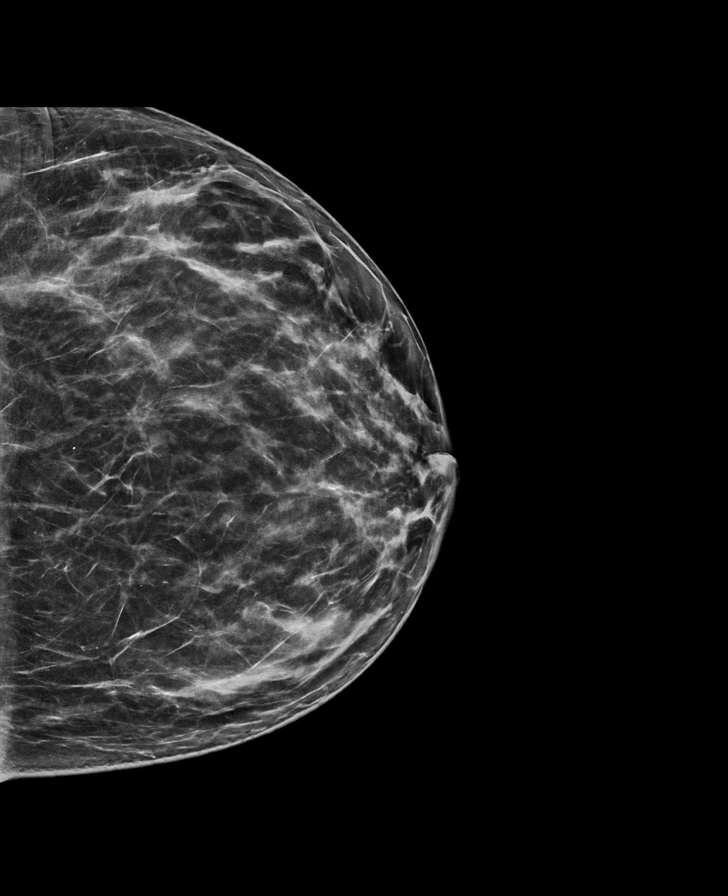

[R CC synth-2D]
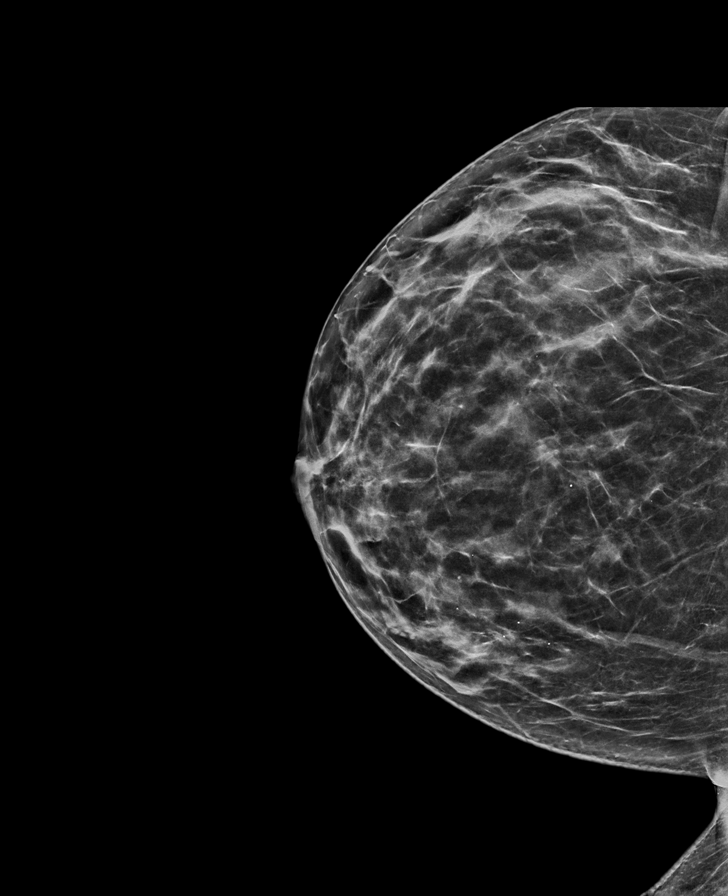

[R MLO synth-2D]
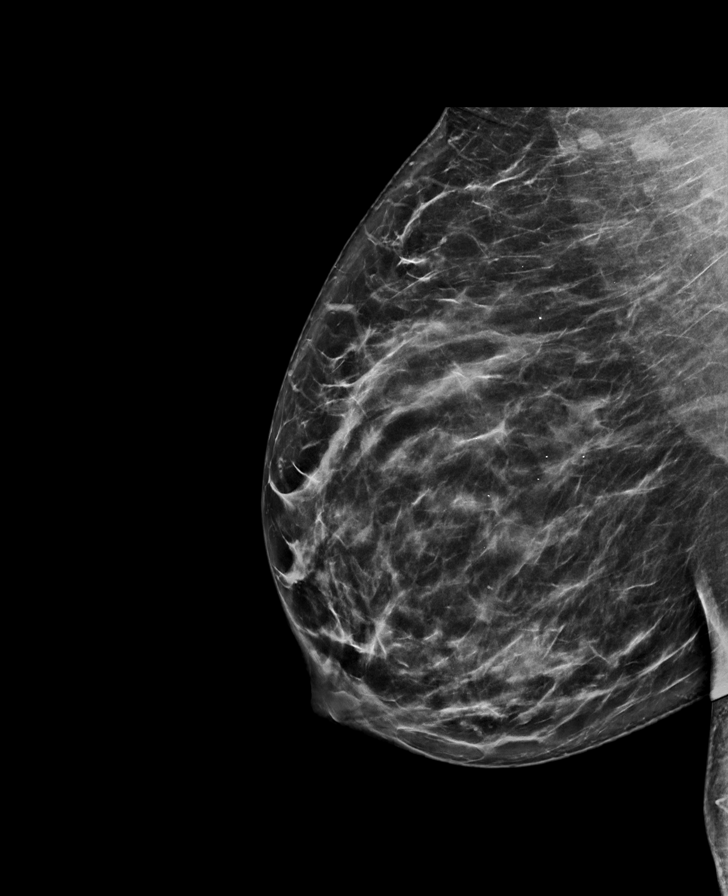

[R MLO tomo · tomo slice 39/77.0]
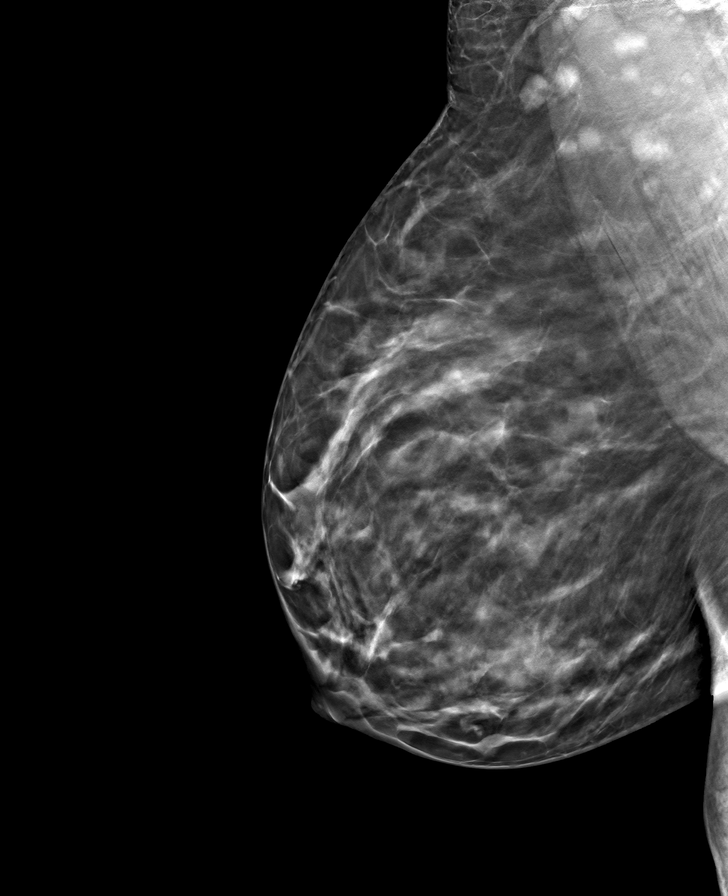

[L CC tomo · tomo slice 38/75.0]
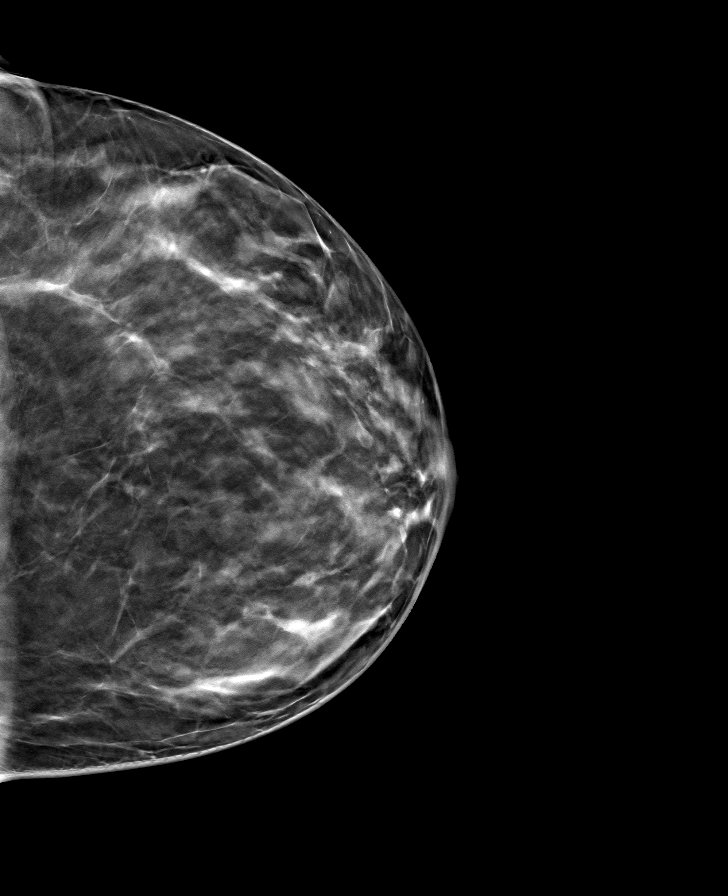

[R CC tomo · tomo slice 35/68.0]
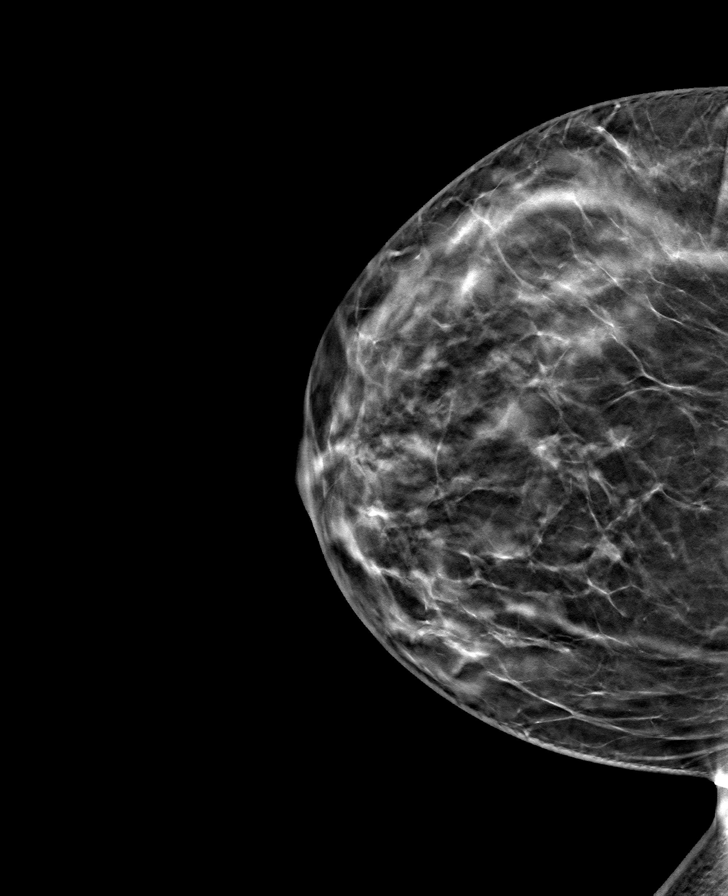

[L MLO tomo · tomo slice 41/82.0]
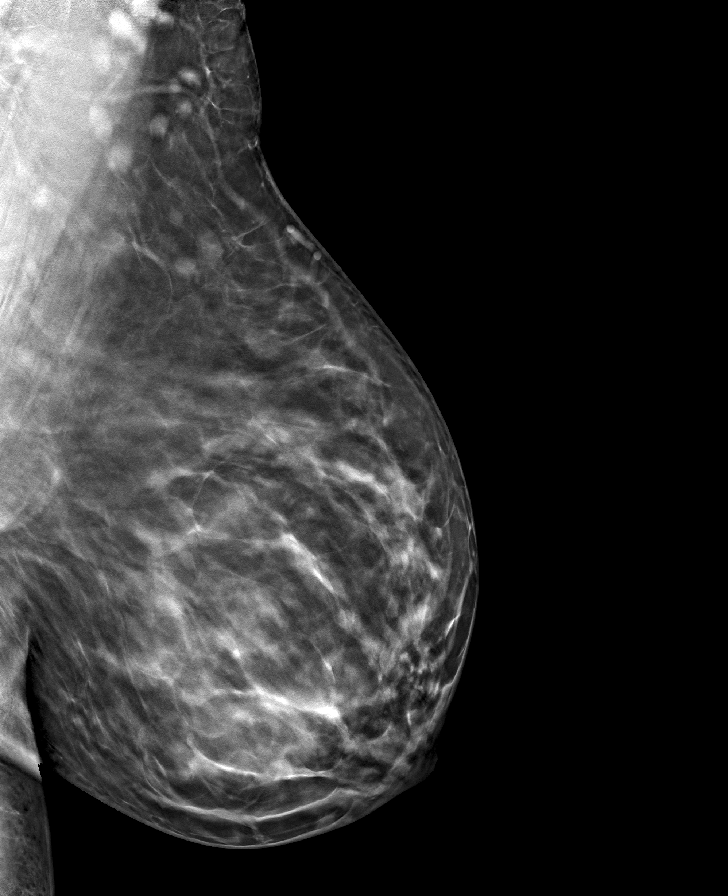

[8 of 24 positions shown; findings below may reference images not displayed]

ACR Breast Density Category c: The breast tissue is heterogeneously
dense, which may obscure small masses.
FINDINGS: There are no findings suspicious for malignancy. Images were
processed with CAD.
IMPRESSION: No mammographic evidence of malignancy. A result letter of this
screening mammogram will be mailed directly to the patient.

RECOMMENDATION:
Screening mammogram in one year. (Code:[id number]-[id number])

BI-RADS CATEGORY  1: Negative.

## 2022-05-09 ENCOUNTER — Other Ambulatory Visit: Payer: BC Managed Care – PPO

## 2022-05-10 ENCOUNTER — Other Ambulatory Visit: Payer: BC Managed Care – PPO

## 2022-05-14 ENCOUNTER — Ambulatory Visit
Admission: RE | Admit: 2022-05-14 | Discharge: 2022-05-14 | Disposition: A | Discharge status: Home / Self Care | Payer: Self-pay | Source: Ambulatory Visit | Attending: Internal Medicine | Admitting: Internal Medicine

## 2022-05-14 DIAGNOSIS — Z8249 Family history of ischemic heart disease and other diseases of the circulatory system: Secondary | ICD-10-CM

## 2022-08-22 DIAGNOSIS — E7800 Pure hypercholesterolemia, unspecified: Secondary | ICD-10-CM | POA: Diagnosis not present

## 2022-08-22 DIAGNOSIS — M545 Low back pain, unspecified: Secondary | ICD-10-CM | POA: Diagnosis not present

## 2022-08-22 DIAGNOSIS — K219 Gastro-esophageal reflux disease without esophagitis: Secondary | ICD-10-CM | POA: Diagnosis not present

## 2022-08-22 DIAGNOSIS — G8929 Other chronic pain: Secondary | ICD-10-CM | POA: Diagnosis not present

## 2022-09-30 DIAGNOSIS — L668 Other cicatricial alopecia: Secondary | ICD-10-CM | POA: Diagnosis not present

## 2022-09-30 DIAGNOSIS — L089 Local infection of the skin and subcutaneous tissue, unspecified: Secondary | ICD-10-CM | POA: Diagnosis not present

## 2022-09-30 DIAGNOSIS — L219 Seborrheic dermatitis, unspecified: Secondary | ICD-10-CM | POA: Diagnosis not present

## 2022-09-30 DIAGNOSIS — L2084 Intrinsic (allergic) eczema: Secondary | ICD-10-CM | POA: Diagnosis not present

## 2023-02-11 DIAGNOSIS — N6320 Unspecified lump in the left breast, unspecified quadrant: Secondary | ICD-10-CM | POA: Diagnosis not present

## 2023-02-11 DIAGNOSIS — R030 Elevated blood-pressure reading, without diagnosis of hypertension: Secondary | ICD-10-CM | POA: Diagnosis not present

## 2023-02-19 DIAGNOSIS — Z1211 Encounter for screening for malignant neoplasm of colon: Secondary | ICD-10-CM | POA: Diagnosis not present

## 2023-02-19 DIAGNOSIS — Z0000 Encounter for general adult medical examination without abnormal findings: Secondary | ICD-10-CM | POA: Diagnosis not present

## 2023-02-19 DIAGNOSIS — N898 Other specified noninflammatory disorders of vagina: Secondary | ICD-10-CM | POA: Diagnosis not present

## 2023-02-19 DIAGNOSIS — E559 Vitamin D deficiency, unspecified: Secondary | ICD-10-CM | POA: Diagnosis not present

## 2023-02-19 DIAGNOSIS — R300 Dysuria: Secondary | ICD-10-CM | POA: Diagnosis not present

## 2023-02-19 DIAGNOSIS — Z90711 Acquired absence of uterus with remaining cervical stump: Secondary | ICD-10-CM | POA: Diagnosis not present

## 2023-02-19 DIAGNOSIS — E7800 Pure hypercholesterolemia, unspecified: Secondary | ICD-10-CM | POA: Diagnosis not present

## 2023-02-19 DIAGNOSIS — Z1231 Encounter for screening mammogram for malignant neoplasm of breast: Secondary | ICD-10-CM | POA: Diagnosis not present

## 2023-02-19 DIAGNOSIS — Z139 Encounter for screening, unspecified: Secondary | ICD-10-CM | POA: Diagnosis not present

## 2023-02-19 DIAGNOSIS — R030 Elevated blood-pressure reading, without diagnosis of hypertension: Secondary | ICD-10-CM | POA: Diagnosis not present

## 2023-02-19 DIAGNOSIS — Z01419 Encounter for gynecological examination (general) (routine) without abnormal findings: Secondary | ICD-10-CM | POA: Diagnosis not present

## 2023-03-10 DIAGNOSIS — L089 Local infection of the skin and subcutaneous tissue, unspecified: Secondary | ICD-10-CM | POA: Diagnosis not present

## 2023-03-10 DIAGNOSIS — L219 Seborrheic dermatitis, unspecified: Secondary | ICD-10-CM | POA: Diagnosis not present

## 2023-03-10 DIAGNOSIS — L668 Other cicatricial alopecia: Secondary | ICD-10-CM | POA: Diagnosis not present

## 2023-03-10 DIAGNOSIS — Z23 Encounter for immunization: Secondary | ICD-10-CM | POA: Diagnosis not present

## 2023-05-22 DIAGNOSIS — Z23 Encounter for immunization: Secondary | ICD-10-CM | POA: Diagnosis not present

## 2023-06-10 DIAGNOSIS — K219 Gastro-esophageal reflux disease without esophagitis: Secondary | ICD-10-CM | POA: Diagnosis not present

## 2023-06-10 DIAGNOSIS — I10 Essential (primary) hypertension: Secondary | ICD-10-CM | POA: Diagnosis not present

## 2023-06-10 DIAGNOSIS — R1013 Epigastric pain: Secondary | ICD-10-CM | POA: Diagnosis not present

## 2023-06-10 DIAGNOSIS — K5900 Constipation, unspecified: Secondary | ICD-10-CM | POA: Diagnosis not present

## 2023-06-23 ENCOUNTER — Other Ambulatory Visit (HOSPITAL_COMMUNITY): Payer: Self-pay | Admitting: Gastroenterology

## 2023-06-23 DIAGNOSIS — K3189 Other diseases of stomach and duodenum: Secondary | ICD-10-CM | POA: Diagnosis not present

## 2023-06-23 DIAGNOSIS — R1033 Periumbilical pain: Secondary | ICD-10-CM | POA: Diagnosis not present

## 2023-06-23 DIAGNOSIS — R1013 Epigastric pain: Secondary | ICD-10-CM | POA: Diagnosis not present

## 2023-06-23 DIAGNOSIS — R933 Abnormal findings on diagnostic imaging of other parts of digestive tract: Secondary | ICD-10-CM

## 2023-06-23 DIAGNOSIS — K2950 Unspecified chronic gastritis without bleeding: Secondary | ICD-10-CM | POA: Diagnosis not present

## 2023-06-23 DIAGNOSIS — K219 Gastro-esophageal reflux disease without esophagitis: Secondary | ICD-10-CM | POA: Diagnosis not present

## 2023-06-23 DIAGNOSIS — K2970 Gastritis, unspecified, without bleeding: Secondary | ICD-10-CM | POA: Diagnosis not present

## 2023-07-22 ENCOUNTER — Ambulatory Visit (HOSPITAL_COMMUNITY): Payer: BC Managed Care – PPO

## 2023-08-04 ENCOUNTER — Ambulatory Visit (HOSPITAL_COMMUNITY): Payer: BC Managed Care – PPO

## 2023-08-08 ENCOUNTER — Ambulatory Visit (HOSPITAL_COMMUNITY): Payer: BC Managed Care – PPO

## 2023-08-11 ENCOUNTER — Ambulatory Visit (HOSPITAL_COMMUNITY): Payer: BC Managed Care – PPO

## 2023-08-25 DIAGNOSIS — N8189 Other female genital prolapse: Secondary | ICD-10-CM | POA: Diagnosis not present

## 2023-08-25 DIAGNOSIS — N898 Other specified noninflammatory disorders of vagina: Secondary | ICD-10-CM | POA: Diagnosis not present

## 2023-08-25 DIAGNOSIS — M545 Low back pain, unspecified: Secondary | ICD-10-CM | POA: Diagnosis not present

## 2023-08-25 DIAGNOSIS — R030 Elevated blood-pressure reading, without diagnosis of hypertension: Secondary | ICD-10-CM | POA: Diagnosis not present

## 2023-09-08 ENCOUNTER — Ambulatory Visit (HOSPITAL_COMMUNITY): Payer: BC Managed Care – PPO

## 2023-09-08 ENCOUNTER — Encounter (HOSPITAL_COMMUNITY): Payer: Self-pay

## 2023-10-06 DIAGNOSIS — R030 Elevated blood-pressure reading, without diagnosis of hypertension: Secondary | ICD-10-CM | POA: Diagnosis not present

## 2023-12-18 ENCOUNTER — Other Ambulatory Visit: Payer: Self-pay | Admitting: Obstetrics and Gynecology

## 2023-12-18 DIAGNOSIS — Z1231 Encounter for screening mammogram for malignant neoplasm of breast: Secondary | ICD-10-CM

## 2024-02-20 DIAGNOSIS — K219 Gastro-esophageal reflux disease without esophagitis: Secondary | ICD-10-CM | POA: Diagnosis not present

## 2024-02-20 DIAGNOSIS — N898 Other specified noninflammatory disorders of vagina: Secondary | ICD-10-CM | POA: Diagnosis not present

## 2024-02-20 DIAGNOSIS — Z1330 Encounter for screening examination for mental health and behavioral disorders, unspecified: Secondary | ICD-10-CM | POA: Diagnosis not present

## 2024-02-20 DIAGNOSIS — M545 Low back pain, unspecified: Secondary | ICD-10-CM | POA: Diagnosis not present

## 2024-02-20 DIAGNOSIS — Z01419 Encounter for gynecological examination (general) (routine) without abnormal findings: Secondary | ICD-10-CM | POA: Diagnosis not present

## 2024-02-20 DIAGNOSIS — F5101 Primary insomnia: Secondary | ICD-10-CM | POA: Diagnosis not present

## 2024-02-20 DIAGNOSIS — E559 Vitamin D deficiency, unspecified: Secondary | ICD-10-CM | POA: Diagnosis not present

## 2024-02-20 DIAGNOSIS — E7800 Pure hypercholesterolemia, unspecified: Secondary | ICD-10-CM | POA: Diagnosis not present

## 2024-02-20 DIAGNOSIS — I10 Essential (primary) hypertension: Secondary | ICD-10-CM | POA: Diagnosis not present

## 2024-02-20 DIAGNOSIS — Z0000 Encounter for general adult medical examination without abnormal findings: Secondary | ICD-10-CM | POA: Diagnosis not present

## 2024-03-15 DIAGNOSIS — L6681 Central centrifugal cicatricial alopecia: Secondary | ICD-10-CM | POA: Diagnosis not present

## 2024-06-22 ENCOUNTER — Encounter: Payer: Self-pay | Admitting: *Deleted

## 2024-07-05 DIAGNOSIS — R079 Chest pain, unspecified: Secondary | ICD-10-CM | POA: Diagnosis not present

## 2024-07-05 DIAGNOSIS — R61 Generalized hyperhidrosis: Secondary | ICD-10-CM | POA: Diagnosis not present

## 2024-07-08 ENCOUNTER — Ambulatory Visit

## 2024-07-26 ENCOUNTER — Ambulatory Visit
Admission: RE | Admit: 2024-07-26 | Discharge: 2024-07-26 | Disposition: A | Discharge status: Home / Self Care | Source: Ambulatory Visit | Attending: Obstetrics and Gynecology | Admitting: Obstetrics and Gynecology

## 2024-07-26 DIAGNOSIS — Z1231 Encounter for screening mammogram for malignant neoplasm of breast: Secondary | ICD-10-CM

## 2024-07-28 ENCOUNTER — Ambulatory Visit

## 2024-08-17 DIAGNOSIS — K219 Gastro-esophageal reflux disease without esophagitis: Secondary | ICD-10-CM | POA: Diagnosis not present

## 2024-08-17 DIAGNOSIS — E7800 Pure hypercholesterolemia, unspecified: Secondary | ICD-10-CM | POA: Diagnosis not present

## 2024-08-17 DIAGNOSIS — G8929 Other chronic pain: Secondary | ICD-10-CM | POA: Diagnosis not present

## 2024-08-17 DIAGNOSIS — E559 Vitamin D deficiency, unspecified: Secondary | ICD-10-CM | POA: Diagnosis not present

## 2024-08-17 DIAGNOSIS — M545 Low back pain, unspecified: Secondary | ICD-10-CM | POA: Diagnosis not present

## 2024-08-17 DIAGNOSIS — I10 Essential (primary) hypertension: Secondary | ICD-10-CM | POA: Diagnosis not present

## 2024-08-17 DIAGNOSIS — Z23 Encounter for immunization: Secondary | ICD-10-CM | POA: Diagnosis not present
